# Patient Record
Sex: Female | Born: 1965 | Race: White | Hispanic: No | Marital: Married | Smoking: Former smoker
Health system: Southern US, Community
[De-identification: ages and names within clinical notes are randomized; demographics above are authoritative.]

## PROBLEM LIST (undated history)

## (undated) DIAGNOSIS — I10 Essential (primary) hypertension: Secondary | ICD-10-CM

## (undated) DIAGNOSIS — F329 Major depressive disorder, single episode, unspecified: Secondary | ICD-10-CM

## (undated) DIAGNOSIS — F32A Depression, unspecified: Secondary | ICD-10-CM

## (undated) DIAGNOSIS — F419 Anxiety disorder, unspecified: Secondary | ICD-10-CM

## (undated) HISTORY — PX: APPENDECTOMY: SHX54

## (undated) HISTORY — DX: Anxiety disorder, unspecified: F41.9

---

## 1990-12-13 HISTORY — PX: TUBAL LIGATION: SHX77

## 2005-05-07 ENCOUNTER — Ambulatory Visit: Payer: Self-pay | Admitting: Family Medicine

## 2005-06-18 ENCOUNTER — Emergency Department: Payer: Self-pay | Admitting: Emergency Medicine

## 2006-08-11 ENCOUNTER — Ambulatory Visit: Payer: Self-pay

## 2007-07-07 DIAGNOSIS — F419 Anxiety disorder, unspecified: Secondary | ICD-10-CM | POA: Insufficient documentation

## 2010-10-28 ENCOUNTER — Ambulatory Visit: Payer: Self-pay

## 2012-01-07 ENCOUNTER — Ambulatory Visit: Payer: Self-pay | Admitting: Family Medicine

## 2012-05-30 ENCOUNTER — Emergency Department: Payer: Self-pay | Admitting: Emergency Medicine

## 2014-06-10 ENCOUNTER — Emergency Department: Payer: Self-pay | Admitting: Emergency Medicine

## 2015-01-14 ENCOUNTER — Ambulatory Visit: Payer: Self-pay | Admitting: Family Medicine

## 2015-02-19 ENCOUNTER — Emergency Department: Payer: Self-pay | Admitting: Emergency Medicine

## 2015-02-28 ENCOUNTER — Ambulatory Visit: Payer: Self-pay | Admitting: Family Medicine

## 2015-03-31 ENCOUNTER — Ambulatory Visit
Admit: 2015-03-31 | Disposition: A | Discharge status: Home / Self Care | Payer: Self-pay | Attending: Orthopedic Surgery | Admitting: Orthopedic Surgery

## 2015-04-12 ENCOUNTER — Ambulatory Visit
Admit: 2015-04-12 | Disposition: A | Discharge status: Home / Self Care | Payer: Self-pay | Attending: Orthopedic Surgery | Admitting: Orthopedic Surgery

## 2015-06-14 ENCOUNTER — Emergency Department
Admission: EM | Admit: 2015-06-14 | Discharge: 2015-06-14 | Disposition: A | Discharge status: Home / Self Care | Payer: Self-pay | Attending: Emergency Medicine | Admitting: Emergency Medicine

## 2015-06-14 ENCOUNTER — Other Ambulatory Visit: Payer: Self-pay

## 2015-06-14 ENCOUNTER — Encounter: Payer: Self-pay | Admitting: Emergency Medicine

## 2015-06-14 DIAGNOSIS — H578 Other specified disorders of eye and adnexa: Secondary | ICD-10-CM | POA: Insufficient documentation

## 2015-06-14 DIAGNOSIS — F419 Anxiety disorder, unspecified: Secondary | ICD-10-CM | POA: Insufficient documentation

## 2015-06-14 DIAGNOSIS — I10 Essential (primary) hypertension: Secondary | ICD-10-CM | POA: Insufficient documentation

## 2015-06-14 DIAGNOSIS — Z87891 Personal history of nicotine dependence: Secondary | ICD-10-CM | POA: Insufficient documentation

## 2015-06-14 DIAGNOSIS — G8929 Other chronic pain: Secondary | ICD-10-CM | POA: Insufficient documentation

## 2015-06-14 DIAGNOSIS — M542 Cervicalgia: Secondary | ICD-10-CM | POA: Insufficient documentation

## 2015-06-14 DIAGNOSIS — R51 Headache: Secondary | ICD-10-CM | POA: Insufficient documentation

## 2015-06-14 DIAGNOSIS — R635 Abnormal weight gain: Secondary | ICD-10-CM | POA: Insufficient documentation

## 2015-06-14 DIAGNOSIS — E162 Hypoglycemia, unspecified: Secondary | ICD-10-CM | POA: Insufficient documentation

## 2015-06-14 DIAGNOSIS — F329 Major depressive disorder, single episode, unspecified: Secondary | ICD-10-CM | POA: Insufficient documentation

## 2015-06-14 HISTORY — DX: Essential (primary) hypertension: I10

## 2015-06-14 LAB — URINALYSIS COMPLETE WITH MICROSCOPIC (ARMC ONLY)
Bilirubin Urine: NEGATIVE
GLUCOSE, UA: NEGATIVE mg/dL
KETONES UR: NEGATIVE mg/dL
Leukocytes, UA: NEGATIVE
Nitrite: NEGATIVE
Protein, ur: NEGATIVE mg/dL
SPECIFIC GRAVITY, URINE: 1.01 (ref 1.005–1.030)
pH: 5 (ref 5.0–8.0)

## 2015-06-14 LAB — COMPREHENSIVE METABOLIC PANEL
ALT: 50 U/L (ref 14–54)
AST: 38 U/L (ref 15–41)
Albumin: 4.2 g/dL (ref 3.5–5.0)
Alkaline Phosphatase: 95 U/L (ref 38–126)
Anion gap: 10 (ref 5–15)
BILIRUBIN TOTAL: 0.6 mg/dL (ref 0.3–1.2)
BUN: 16 mg/dL (ref 6–20)
CO2: 26 mmol/L (ref 22–32)
Calcium: 9.4 mg/dL (ref 8.9–10.3)
Chloride: 103 mmol/L (ref 101–111)
Creatinine, Ser: 0.94 mg/dL (ref 0.44–1.00)
GFR calc Af Amer: >60 mL/min (ref >60)
GFR calc non Af Amer: >60 mL/min (ref >60)
Glucose, Bld: 134 mg/dL — ABNORMAL HIGH (ref 65–99)
POTASSIUM: 3.5 mmol/L (ref 3.5–5.1)
SODIUM: 139 mmol/L (ref 135–145)
Total Protein: 7.9 g/dL (ref 6.5–8.1)

## 2015-06-14 LAB — CBC
HCT: 43.5 % (ref 35.0–47.0)
Hemoglobin: 14.7 g/dL (ref 12.0–16.0)
MCH: 29.3 pg (ref 26.0–34.0)
MCHC: 33.8 g/dL (ref 32.0–36.0)
MCV: 86.8 fL (ref 80.0–100.0)
PLATELETS: 294 10*3/uL (ref 150–440)
RBC: 5.02 MIL/uL (ref 3.80–5.20)
RDW: 12.8 % (ref 11.5–14.5)
WBC: 8.7 10*3/uL (ref 3.6–11.0)

## 2015-06-14 LAB — TROPONIN I
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
## 2015-06-14 NOTE — ED Notes (Signed)
States felt dizzy for few seconds at 12 noon, continued to feel weak and nauseated after, able to eat after, states used childs glucometer, was 76 before eating, 85 after. Denies chest pain. States did feel winded.

## 2015-06-14 NOTE — Discharge Instructions (Signed)
Return to the emergency department for any worsening condition including chest pain, trouble breathing, weakness, numbness, confusion or altered mental status, depression or thoughts of wanting to harm yourself or others.  We discussed, try to avoid sugar as this may make you more likely to have a reactive low blood sugar. Read about incorporating high-quality fats in order to stabilize blood sugar including coconut oil, and avocados.  Follow up with a primary care doctor for further evaluation and management. I recommended following up with [facility] for intake assessment including possible counselor or psych history referral for depression and anxiety.   Hypoglycemia Hypoglycemia occurs when the glucose in your blood is too low. Glucose is a type of sugar that is your body's main energy source. Hormones, such as insulin and glucagon, control the level of glucose in the blood. Insulin lowers blood glucose and glucagon increases blood glucose. Having too much insulin in your blood stream, or not eating enough food containing sugar, can result in hypoglycemia. Hypoglycemia can happen to people with or without diabetes. It can develop quickly and can be a medical emergency.  CAUSES   Missing or delaying meals.  Not eating enough carbohydrates at meals.  Taking too much diabetes medicine.  Not timing your oral diabetes medicine or insulin doses with meals, snacks, and exercise.  Nausea and vomiting.  Certain medicines.  Severe illnesses, such as hepatitis, kidney disorders, and certain eating disorders.  Increased activity or exercise without eating something extra or adjusting medicines.  Drinking too much alcohol.  A nerve disorder that affects body functions like your heart rate, blood pressure, and digestion (autonomic neuropathy).  A condition where the stomach muscles do not function properly (gastroparesis). Therefore, medicines and food may not absorb properly.  Rarely, a tumor of the  pancreas can produce too much insulin. SYMPTOMS   Hunger.  Sweating (diaphoresis).  Change in body temperature.  Shakiness.  Headache.  Anxiety.  Lightheadedness.  Irritability.  Difficulty concentrating.  Dry mouth.  Tingling or numbness in the hands or feet.  Restless sleep or sleep disturbances.  Altered speech and coordination.  Change in mental status.  Seizures or prolonged convulsions.  Combativeness.  Drowsiness (lethargic).  Weakness.  Increased heart rate or palpitations.  Confusion.  Pale, gray skin color.  Blurred or double vision.  Fainting. DIAGNOSIS  A physical exam and medical history will be performed. Your caregiver may make a diagnosis based on your symptoms. Blood tests and other lab tests may be performed to confirm a diagnosis. Once the diagnosis is made, your caregiver will see if your signs and symptoms go away once your blood glucose is raised.  TREATMENT  Usually, you can easily treat your hypoglycemia when you notice symptoms.  Check your blood glucose. If it is less than 70 mg/dl, take one of the following:   3-4 glucose tablets.    cup juice.    cup regular soda.   1 cup skim milk.   -1 tube of glucose gel.   5-6 hard candies.   Avoid high-fat drinks or food that may delay a rise in blood glucose levels.  Do not take more than the recommended amount of sugary foods, drinks, gel, or tablets. Doing so will cause your blood glucose to go too high.   Wait 10-15 minutes and recheck your blood glucose. If it is still less than 70 mg/dl or below your target range, repeat treatment.   Eat a snack if it is more than 1 hour until your  next meal.  There may be a time when your blood glucose may go so low that you are unable to treat yourself at home when you start to notice symptoms. You may need someone to help you. You may even faint or be unable to swallow. If you cannot treat yourself, someone will need to  bring you to the hospital.  HOME CARE INSTRUCTIONS  If you have diabetes, follow your diabetes management plan by:  Taking your medicines as directed.  Following your exercise plan.  Following your meal plan. Do not skip meals. Eat on time.  Testing your blood glucose regularly. Check your blood glucose before and after exercise. If you exercise longer or different than usual, be sure to check blood glucose more frequently.  Wearing your medical alert jewelry that says you have diabetes.  Identify the cause of your hypoglycemia. Then, develop ways to prevent the recurrence of hypoglycemia.  Do not take a hot bath or shower right after an insulin shot.  Always carry treatment with you. Glucose tablets are the easiest to carry.  If you are going to drink alcohol, drink it only with meals.  Tell friends or family members ways to keep you safe during a seizure. This may include removing hard or sharp objects from the area or turning you on your side.  Maintain a healthy weight. SEEK MEDICAL CARE IF:   You are having problems keeping your blood glucose in your target range.  You are having frequent episodes of hypoglycemia.  You feel you might be having side effects from your medicines.  You are not sure why your blood glucose is dropping so low.  You notice a change in vision or a new problem with your vision. SEEK IMMEDIATE MEDICAL CARE IF:   Confusion develops.  A change in mental status occurs.  The inability to swallow develops.  Fainting occurs. Document Released: 11/29/2005 Document Revised: 12/04/2013 Document Reviewed: 03/27/2012 [facility] Patient Information 2015 [place], [place]. This information is not intended to replace advice given to you by your health care provider. Make sure you discuss any questions you have with your health care provider.

## 2015-06-17 ENCOUNTER — Telehealth: Payer: Self-pay | Admitting: Family Medicine

## 2015-06-17 NOTE — Telephone Encounter (Signed)
Pt was discharged from [facility] ER on 06/14/2015 for blood sugar dropped and anxiety/MJ

## 2015-06-18 DIAGNOSIS — M25519 Pain in unspecified shoulder: Secondary | ICD-10-CM | POA: Insufficient documentation

## 2015-06-18 DIAGNOSIS — E559 Vitamin D deficiency, unspecified: Secondary | ICD-10-CM | POA: Insufficient documentation

## 2015-06-18 DIAGNOSIS — L309 Dermatitis, unspecified: Secondary | ICD-10-CM | POA: Insufficient documentation

## 2015-06-18 DIAGNOSIS — IMO0002 Reserved for concepts with insufficient information to code with codable children: Secondary | ICD-10-CM | POA: Insufficient documentation

## 2015-06-18 DIAGNOSIS — M545 Low back pain, unspecified: Secondary | ICD-10-CM | POA: Insufficient documentation

## 2015-06-18 DIAGNOSIS — N912 Amenorrhea, unspecified: Secondary | ICD-10-CM | POA: Insufficient documentation

## 2015-06-18 DIAGNOSIS — M542 Cervicalgia: Secondary | ICD-10-CM | POA: Insufficient documentation

## 2015-06-18 DIAGNOSIS — I10 Essential (primary) hypertension: Secondary | ICD-10-CM | POA: Insufficient documentation

## 2015-06-19 ENCOUNTER — Ambulatory Visit (INDEPENDENT_AMBULATORY_CARE_PROVIDER_SITE_OTHER): Payer: Self-pay | Admitting: Family Medicine

## 2015-06-19 ENCOUNTER — Encounter: Payer: Self-pay | Admitting: Family Medicine

## 2015-06-19 ENCOUNTER — Other Ambulatory Visit: Payer: Self-pay | Admitting: Family Medicine

## 2015-06-19 VITALS — BP 140/80 | HR 78 | Temp 97.8°F | Resp 18 | Wt 258.0 lb

## 2015-06-19 DIAGNOSIS — F32A Depression, unspecified: Secondary | ICD-10-CM

## 2015-06-19 DIAGNOSIS — F329 Major depressive disorder, single episode, unspecified: Secondary | ICD-10-CM

## 2015-06-19 DIAGNOSIS — E162 Hypoglycemia, unspecified: Secondary | ICD-10-CM

## 2015-06-19 LAB — POCT GLYCOSYLATED HEMOGLOBIN (HGB A1C): Hemoglobin A1C: 5.4

## 2015-06-19 MED ORDER — ESCITALOPRAM OXALATE 10 MG PO TABS: 10.0000 mg | ORAL_TABLET | Freq: Every day | ORAL | Status: DC

## 2015-06-19 MED ORDER — CITALOPRAM HYDROBROMIDE 20 MG PO TABS: ORAL_TABLET | ORAL | Status: DC

## 2015-06-19 NOTE — Patient Instructions (Addendum)
Telephone f/u in 2 weeks. Try to avoid foods that may contribute to low blood sugar. Quit checking blood sugars except twice weekly.
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
## 2015-06-20 ENCOUNTER — Encounter: Payer: Self-pay | Admitting: Emergency Medicine

## 2015-06-20 ENCOUNTER — Emergency Department
Admission: EM | Admit: 2015-06-20 | Discharge: 2015-06-21 | Disposition: A | Discharge status: Home / Self Care | Payer: Self-pay | Attending: Emergency Medicine | Admitting: Emergency Medicine

## 2015-06-20 ENCOUNTER — Telehealth: Payer: Self-pay | Admitting: Family Medicine

## 2015-06-20 ENCOUNTER — Emergency Department
Admission: EM | Admit: 2015-06-20 | Discharge: 2015-06-20 | Disposition: A | Discharge status: Home / Self Care | Payer: Self-pay | Attending: Emergency Medicine | Admitting: Emergency Medicine

## 2015-06-20 DIAGNOSIS — I10 Essential (primary) hypertension: Secondary | ICD-10-CM | POA: Insufficient documentation

## 2015-06-20 DIAGNOSIS — Z79899 Other long term (current) drug therapy: Secondary | ICD-10-CM | POA: Insufficient documentation

## 2015-06-20 DIAGNOSIS — F419 Anxiety disorder, unspecified: Secondary | ICD-10-CM | POA: Insufficient documentation

## 2015-06-20 DIAGNOSIS — F329 Major depressive disorder, single episode, unspecified: Secondary | ICD-10-CM | POA: Insufficient documentation

## 2015-06-20 DIAGNOSIS — Z87891 Personal history of nicotine dependence: Secondary | ICD-10-CM | POA: Insufficient documentation

## 2015-06-20 DIAGNOSIS — F32A Depression, unspecified: Secondary | ICD-10-CM

## 2015-06-20 HISTORY — DX: Depression, unspecified: F32.A

## 2015-06-20 HISTORY — DX: Major depressive disorder, single episode, unspecified: F32.9

## 2015-06-20 LAB — URINALYSIS COMPLETE WITH MICROSCOPIC (ARMC ONLY)
BILIRUBIN URINE: NEGATIVE
Glucose, UA: NEGATIVE mg/dL
KETONES UR: NEGATIVE mg/dL
Nitrite: NEGATIVE
PROTEIN: 30 mg/dL — AB
Specific Gravity, Urine: 1.01 (ref 1.005–1.030)
pH: 7 (ref 5.0–8.0)

## 2015-06-20 LAB — URINE DRUG SCREEN, QUALITATIVE (ARMC ONLY)
AMPHETAMINES, UR SCREEN: NOT DETECTED
Barbiturates, Ur Screen: NOT DETECTED
Benzodiazepine, Ur Scrn: NOT DETECTED
COCAINE METABOLITE, UR ~~LOC~~: NOT DETECTED
Cannabinoid 50 Ng, Ur ~~LOC~~: NOT DETECTED
MDMA (ECSTASY) UR SCREEN: NOT DETECTED
Methadone Scn, Ur: NOT DETECTED
Opiate, Ur Screen: NOT DETECTED
Phencyclidine (PCP) Ur S: NOT DETECTED
TRICYCLIC, UR SCREEN: NOT DETECTED

## 2015-06-20 LAB — COMPREHENSIVE METABOLIC PANEL
ALT: 59 U/L — ABNORMAL HIGH (ref 14–54)
AST: 42 U/L — ABNORMAL HIGH (ref 15–41)
Albumin: 4.7 g/dL (ref 3.5–5.0)
Alkaline Phosphatase: 99 U/L (ref 38–126)
Anion gap: 13 (ref 5–15)
BUN: 14 mg/dL (ref 6–20)
CO2: 23 mmol/L (ref 22–32)
Calcium: 9.8 mg/dL (ref 8.9–10.3)
Chloride: 103 mmol/L (ref 101–111)
Creatinine, Ser: 0.95 mg/dL (ref 0.44–1.00)
GFR calc Af Amer: >60 mL/min (ref >60)
GFR calc non Af Amer: >60 mL/min (ref >60)
Glucose, Bld: 119 mg/dL — ABNORMAL HIGH (ref 65–99)
Potassium: 3.6 mmol/L (ref 3.5–5.1)
Sodium: 139 mmol/L (ref 135–145)
Total Bilirubin: 0.9 mg/dL (ref 0.3–1.2)
Total Protein: 8.6 g/dL — ABNORMAL HIGH (ref 6.5–8.1)

## 2015-06-20 LAB — CBC
HCT: 45.5 % (ref 35.0–47.0)
Hemoglobin: 15.3 g/dL (ref 12.0–16.0)
MCH: 28.9 pg (ref 26.0–34.0)
MCHC: 33.6 g/dL (ref 32.0–36.0)
MCV: 85.9 fL (ref 80.0–100.0)
PLATELETS: 343 10*3/uL (ref 150–440)
RBC: 5.3 MIL/uL — AB (ref 3.80–5.20)
RDW: 13 % (ref 11.5–14.5)
WBC: 11.1 10*3/uL — ABNORMAL HIGH (ref 3.6–11.0)

## 2015-06-20 LAB — SALICYLATE LEVEL: Salicylate Lvl: <4 mg/dL (ref 2.8–30.0)

## 2015-06-20 LAB — ETHANOL: Alcohol, Ethyl (B): <5 mg/dL (ref <5)

## 2015-06-20 LAB — ACETAMINOPHEN LEVEL: Acetaminophen (Tylenol), Serum: <10 ug/mL — ABNORMAL LOW (ref 10–30)

## 2015-06-20 NOTE — ED Notes (Addendum)
Pt laying in the bed

## 2015-06-20 NOTE — ED Notes (Signed)

## 2015-06-20 NOTE — ED Notes (Signed)
Pt with agitation  "I just want a pill to make my nerves calm down - i don't want to talk -I just want a pill."   Labs reviewed with her and when I told her about her glucose being 119 she stated  "My sugar stays there - and what is being done about it  - I know that my sugar drops low."  Pt informed that her glucose was actually a little high and she turned and groaned as if I do not know what is high or low   Drink and crackers provided as a snack and to ease her mind that her sugar will not drop too low

## 2015-06-20 NOTE — Telephone Encounter (Signed)
Pt went to [facility] today and the ER.  She wants you to call her back.  Still having extreem anxiety/ [phone].

## 2015-06-20 NOTE — ED Notes (Signed)
Pt continues to cry and say "yall please help me, i need help."

## 2015-06-20 NOTE — ED Notes (Signed)
BEHAVIORAL HEALTH ROUNDING Patient sleeping: No. Patient alert and oriented: yes Behavior appropriate: Yes.  ; If no, describe:  Nutrition and fluids offered: yes Toileting and hygiene offered: Yes  Sitter present: q15 minute observations and security camera monitoring Law enforcement present: Yes  ODS  
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
## 2015-06-20 NOTE — ED Notes (Signed)
Pt given meal tray.

## 2015-06-20 NOTE — Telephone Encounter (Signed)
Left message. No answer x 2
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
## 2015-06-20 NOTE — ED Notes (Addendum)
BEHAVIORAL HEALTH ROUNDING  Patient sleeping: YES Patient alert and oriented: YES Behavior appropriate: YES. ; If no, describe:  Nutrition and fluids offered: YES Toileting and hygiene offered: Programmer, multimediaYES Sitter present: YES  Patent examinerLaw enforcement present: YES   ENVIRONMENTAL ASSESSMENT  Potentially harmful objects out of patient reach: YES  Personal belongings secured: YES  Patient dressed in hospital provided attire only: YES Plastic bags out of patient reach: YES Patient care equipment (cords, cables, call bells, lines, and drains) shortened, removed, or accounted for: YES Equipment and supplies removed from bottom of stretcher: YES Potentially toxic materials out of patient reach: [place] Sharps container removed or out of patient reach: PT IN [employer]

## 2015-06-20 NOTE — ED Notes (Signed)
Pt transferred into ED BHU room 5    Patient assigned to appropriate care area. Patient oriented to unit/care area: Informed that, for their safety, care areas are designed for safety and monitored by security cameras at all times; Visiting hours and phone times explained to patient. Patient verbalizes understanding, and verbal contract for safety obtained.

## 2015-06-20 NOTE — ED Notes (Signed)
ED BHU PLACEMENT JUSTIFICATION Is the patient under IVC or is there intent for IVC: Yes.   Is the patient medically cleared: Yes.   Is there vacancy in the ED BHU: Yes.   Is the population mix appropriate for patient: Yes.   Is the patient awaiting placement in inpatient or outpatient setting: no   Has the patient had a psychiatric consult:  pending  Survey of unit performed for contraband, proper placement and condition of furniture, tampering with fixtures in bathroom, shower, and each patient room: Yes.  ; Findings:  APPEARANCE/BEHAVIOR Calm and cooperative NEURO ASSESSMENT Orientation: oriented x3  Denies pain Hallucinations: No.None noted (Hallucinations) Speech: Normal Gait: normal RESPIRATORY ASSESSMENT Even  Unlabored respirations  CARDIOVASCULAR ASSESSMENT Pulses equal   regular rate  Skin warm and dry   GASTROINTESTINAL ASSESSMENT no GI complaint EXTREMITIES Full ROM  PLAN OF CARE Provide calm/safe environment. Vital signs assessed twice daily. ED BHU Assessment once each 12-hour shift. Collaborate with intake RN daily or as condition indicates. Assure the ED provider has rounded once each shift. Provide and encourage hygiene. Provide redirection as needed. Assess for escalating behavior; address immediately and inform ED provider.  Assess family dynamic and appropriateness for visitation as needed: Yes.  ; If necessary, describe findings:  Educate the patient/family about BHU procedures/visitation: Yes.  ; If necessary, describe findings:
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
## 2015-06-20 NOTE — ED Notes (Signed)
Patient to ED for high anxiety. She was given a prescription for Citalopram and when she tried to take it she "freaked out." States she can't bring herself to take it because she is afraid something will happen. States she knows she has anxiety because of her living situation but can't make her "mind stop." Denies SI or HI just needs to "get it fixed." Says she can't take her yeast infection medication either because she is afraid something will happen.

## 2015-06-20 NOTE — Telephone Encounter (Signed)
Advise

## 2015-06-20 NOTE — ED Notes (Addendum)
Dinner tray delivered to patient.  No other concerns expressed at this time by the patient.

## 2015-06-20 NOTE — ED Notes (Signed)
[facility]. Patient care assumed. Patient/RN introduction complete.  Margaret Rn in to eval, pt calm and cooperative at this time will continue to monitor.

## 2015-06-20 NOTE — ED Notes (Signed)

## 2015-06-20 NOTE — ED Notes (Signed)
Pt to ED c/o anxiety and unable to sleep. Pt denies SI or HI, states that her stressors are work and financially related.

## 2015-06-20 NOTE — ED Notes (Signed)

## 2015-06-20 NOTE — ED Notes (Signed)

## 2015-06-20 NOTE — ED Notes (Signed)
Pt appears very anxious, states that she cannot get her mind to stop racing, unable to sleep. Pt informed that it is very important that she follows up with [facility] upon discharge from ER. Pt states that she does not understand why she is feeling like this, no previous hx of such. Pt in room at this time with lights dimmed to rest and calm down. Denies SI or HI

## 2015-06-20 NOTE — Discharge Instructions (Signed)
Please seek medical attention and help for any thoughts about wanting to harm herself, harm others, any concerning change in behavior, severe depression, inappropriate drug use or any other new or concerning symptoms. Generalized Anxiety Disorder Generalized anxiety disorder (GAD) is a mental disorder. It interferes with life functions, including relationships, work, and school. GAD is different from normal anxiety, which everyone experiences at some point in their lives in response to specific life events and activities. Normal anxiety actually helps us prepare for and get through these life events and activities. Normal anxiety goes away after the event or activity is over.  GAD causes anxiety that is not necessarily related to specific events or activities. It also causes excess anxiety in proportion to specific events or activities. The anxiety associated with GAD is also difficult to control. GAD can vary from mild to severe. People with severe GAD can have intense waves of anxiety with physical symptoms (panic attacks).  SYMPTOMS The anxiety and worry associated with GAD are difficult to control. This anxiety and worry are related to many life events and activities and also occur more days than not for 6 months or longer. People with GAD also have three or more of the following symptoms (one or more in children):  Restlessness.   Fatigue.  Difficulty concentrating.   Irritability.  Muscle tension.  Difficulty sleeping or unsatisfying sleep. DIAGNOSIS GAD is diagnosed through an assessment by your health care provider. Your health care provider will ask you questions aboutyour mood,physical symptoms, and events in your life. Your health care provider may ask you about your medical history and use of alcohol or drugs, including prescription medicines. Your health care provider may also do a physical exam and blood tests. Certain medical conditions and the use of certain substances can  cause symptoms similar to those associated with GAD. Your health care provider may refer you to a mental health specialist for further evaluation. TREATMENT The following therapies are usually used to treat GAD:   Medication. Antidepressant medication usually is prescribed for long-term daily control. Antianxiety medicines may be added in severe cases, especially when panic attacks occur.   Talk therapy (psychotherapy). Certain types of talk therapy can be helpful in treating GAD by providing support, education, and guidance. A form of talk therapy called cognitive behavioral therapy can teach you healthy ways to think about and react to daily life events and activities.  Stress managementtechniques. These include yoga, meditation, and exercise and can be very helpful when they are practiced regularly. A mental health specialist can help determine which treatment is best for you. Some people see improvement with one therapy. However, other people require a combination of therapies. Document Released: 03/26/2013 Document Revised: 04/15/2014 Document Reviewed: 03/26/2013 [facility] Patient Information 2015 [place], [place]. This information is not intended to replace advice given to you by your health care provider. Make sure you discuss any questions you have with your health care provider.
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
## 2015-06-20 NOTE — ED Notes (Signed)
Pt alert and oriented X4, active, cooperative, pt in NAD. RR even and unlabored, color WNL.  Pt informed to return if any life threatening symptoms occur.   

## 2015-06-20 NOTE — ED Notes (Signed)

## 2015-06-21 MED ORDER — ACETAMINOPHEN 325 MG PO TABS
650.0000 mg | ORAL_TABLET | Freq: Four times a day (QID) | ORAL | Status: DC | PRN
  Administered 2015-06-21: 650 mg via ORAL
  Filled 2015-06-21: qty 2

## 2015-06-21 MED ORDER — HYDROCHLOROTHIAZIDE 12.5 MG PO CAPS
12.5000 mg | ORAL_CAPSULE | Freq: Every day | ORAL | Status: DC
  Administered 2015-06-21: 12.5 mg via ORAL

## 2015-06-21 MED ORDER — HYDROCHLOROTHIAZIDE 12.5 MG PO CAPS
ORAL_CAPSULE | ORAL | Status: AC
Start: 2015-06-21 — End: 2015-06-21
  Administered 2015-06-21: 12.5 mg via ORAL
  Filled 2015-06-21: qty 1

## 2015-06-21 MED ORDER — METOPROLOL SUCCINATE ER 50 MG PO TB24
50.0000 mg | ORAL_TABLET | Freq: Every day | ORAL | Status: DC
  Administered 2015-06-21: 50 mg via ORAL

## 2015-06-21 MED ORDER — VITAMIN E 180 MG (400 UNIT) PO CAPS
400.0000 [IU] | ORAL_CAPSULE | Freq: Every day | ORAL | Status: DC
  Administered 2015-06-21: 400 [IU] via ORAL
  Filled 2015-06-21: qty 1

## 2015-06-21 MED ORDER — HYDROXYZINE HCL 25 MG PO TABS
25.0000 mg | ORAL_TABLET | Freq: Three times a day (TID) | ORAL | Status: DC
  Administered 2015-06-21: 25 mg via ORAL
  Filled 2015-06-21: qty 1

## 2015-06-21 MED ORDER — VITAMIN D 1000 UNITS PO TABS
1000.0000 [IU] | ORAL_TABLET | Freq: Every day | ORAL | Status: DC
  Administered 2015-06-21: 1000 [IU] via ORAL

## 2015-06-21 MED ORDER — VITAMIN D 1000 UNITS PO TABS
ORAL_TABLET | ORAL | Status: AC
  Administered 2015-06-21: 1000 [IU] via ORAL
  Filled 2015-06-21: qty 1

## 2015-06-21 MED ORDER — METOPROLOL SUCCINATE ER 50 MG PO TB24
ORAL_TABLET | ORAL | Status: AC
  Administered 2015-06-21: 50 mg via ORAL
  Filled 2015-06-21: qty 1

## 2015-06-21 MED ORDER — METOPROLOL TARTRATE 25 MG PO TABS
ORAL_TABLET | ORAL | Status: AC
  Filled 2015-06-21: qty 2

## 2015-06-21 MED ORDER — HYDROXYZINE PAMOATE 50 MG PO CAPS: 50.0000 mg | ORAL_CAPSULE | Freq: Four times a day (QID) | ORAL | Status: DC | PRN

## 2015-06-21 NOTE — ED Notes (Signed)
No change in condition, will continue to monitor.  

## 2015-06-21 NOTE — Discharge Instructions (Signed)
You have been seen in the Emergency Department (ED)  today for psychiatric issues.  You have been evaluated by the behavioral medicine specialists and are being referred to:  [facility] [address] [place], [place] [address] Phone:  [phone] or [phone]  Open Access:   Walk-in ASSESSMENT hours, M-W-F, 8:00am - 3:00pm Advanced Acess CRISIS:  M-F, 8:00am - 8:00pm Outpatient Services Office Hours:  M-F, 8:00am - 5:00pm  Please return to the Emergency Department (ED)  immediately if you have ANY thoughts of hurting yourself or anyone else, so that we may help you.  Please avoid alcohol and drug use.  Follow up with your doctor and/or therapist as soon as possible regarding today's ED  visit.   Please follow up any other recommendations and clinic appointments provided by the psychiatry team that saw you in the Emergency Department.   Panic Attacks Panic attacks are sudden, short-livedsurges of severe anxiety, fear, or discomfort. They may occur for no reason when you are relaxed, when you are anxious, or when you are sleeping. Panic attacks may occur for a number of reasons:   Healthy people occasionally have panic attacks in extreme, life-threatening situations, such as war or natural disasters. Normal anxiety is a protective mechanism of the body that helps [place] react to danger (fight or flight response).  Panic attacks are often seen with anxiety disorders, such as panic disorder, social anxiety disorder, generalized anxiety disorder, and phobias. Anxiety disorders cause excessive or uncontrollable anxiety. They may interfere with your relationships or other life activities.  Panic attacks are sometimes seen with other mental illnesses, such as depression and posttraumatic stress disorder.  Certain medical conditions, prescription medicines, and drugs of abuse can cause panic attacks. SYMPTOMS  Panic attacks start suddenly, peak within  20 minutes, and are accompanied by four or more of the following symptoms:  Pounding heart or fast heart rate (palpitations).  Sweating.  Trembling or shaking.  Shortness of breath or feeling smothered.  Feeling choked.  Chest pain or discomfort.  Nausea or strange feeling in your stomach.  Dizziness, light-headedness, or feeling like you will faint.  Chills or hot flushes.  Numbness or tingling in your lips or hands and feet.  Feeling that things are not real or feeling that you are not yourself.  Fear of losing control or going crazy.  Fear of dying. Some of these symptoms can mimic serious medical conditions. For example, you may think you are having a heart attack. Although panic attacks can be very scary, they are not life threatening. DIAGNOSIS  Panic attacks are diagnosed through an assessment by your health care provider. Your health care provider will ask questions about your symptoms, such as where and when they occurred. Your health care provider will also ask about your medical history and use of alcohol and drugs, including prescription medicines. Your health care provider may order blood tests or other studies to rule out a serious medical condition. Your health care provider may refer you to a mental health professional for further evaluation. TREATMENT   Most healthy people who have one or two panic attacks in an extreme, life-threatening situation will not require treatment.  The treatment for panic attacks associated with anxiety disorders or other mental illness typically involves counseling with a mental health professional, medicine, or a combination of both. Your health care provider will help determine what treatment is best for you.  Panic attacks due to physical illness usually go away  with treatment of the illness. If prescription medicine is causing panic attacks, talk with your health care provider about stopping the medicine, decreasing the dose, or  substituting another medicine.  Panic attacks due to alcohol or drug abuse go away with abstinence. Some adults need professional help in order to stop drinking or using drugs. HOME CARE INSTRUCTIONS   Take all medicines as directed by your health care provider.   Schedule and attend follow-up visits as directed by your health care provider. It is important to keep all your appointments. SEEK MEDICAL CARE IF:  You are not able to take your medicines as prescribed.  Your symptoms do not improve or get worse. SEEK IMMEDIATE MEDICAL CARE IF:   You experience panic attack symptoms that are different than your usual symptoms.  You have serious thoughts about hurting yourself or others.  You are taking medicine for panic attacks and have a serious side effect. MAKE SURE YOU:  Understand these instructions.  Will watch your condition.  Will get help right away if you are not doing well or get worse. Document Released: 11/29/2005 Document Revised: 12/04/2013 Document Reviewed: 07/13/2013 [facility]Exit[facility] Patient Information 2015 [place], [place]. This information is not intended to replace advice given to you by your health care provider. Make sure you discuss any questions you have with your health care provider.  Social Anxiety Disorder Social anxiety disorder, previously called social phobia, is a mental disorder. People with social anxiety disorder frequently feel nervous, afraid, or embarrassed when around other people in social situations. They constantly worry that other people are judging or criticizing them for how they look, what they say, or how they act. They may worry that other people might reject them because of their appearance or behavior. Social anxiety disorder is more than just occasional shyness or self-consciousness. It can cause severe emotional distress. It can interfere with daily life activities. Social anxiety disorder also may lead to excessive alcohol or drug use and even  suicide.  Social anxiety disorder is actually one of the most common mental disorders. It can develop at any time but usually starts in the teenage years. Women are more commonly affected than men. Social anxiety disorder is also more common in people who have family members with anxiety disorders. It also is more common in people who have physical deformities or conditions with characteristics that are obvious to others, such as stuttered speech or movement abnormalities (Parkinson disease).  SYMPTOMS  In addition to feeling anxious or fearful in social situations, people with social anxiety disorder frequently have physical symptoms. Examples include:  Red face (blushing).  Racing heart.  Sweating.  Shaky hands or voice.  Confusion.  Light-headedness.  Upset stomach and diarrhea. DIAGNOSIS  Social anxiety disorder is diagnosed through an assessment by your health care provider. Your health care provider will ask you questions about your mood, thoughts, and reactions in social situations. Your health care provider may ask you about your medical history and use of alcohol or drugs, including prescription medicines. Certain medical conditions and the use of certain substances, including caffeine, can cause symptoms similar to social anxiety disorder. Your health care provider may refer you to a mental health specialist for further evaluation or treatment. The criteria for diagnosis of social anxiety disorder are:  Marked fear or anxiety in one or more social situations in which you may be closely watched or studied by others. Examples of such situations include:  Interacting socially (having a conversation with others, going to  a party, or meeting strangers).  Being observed (eating or drinking in public or being called on in class).  Performing in front of others (giving a speech).  The social situations of concern almost always cause fear or anxiety, not just occasionally.  People  with social anxiety disorder fear that they will be viewed negatively in a way that will be embarrassing, will lead to rejection, or will offend others. This fear is out of proportion to the actual threat posed by the social situation.  Often the triggering social situations are avoided, or they are endured with intense fear or anxiety. The fear, anxiety, or avoidance is persistent and lasts for 6 months or longer.  The anxiety causes difficulty functioning in at least some parts of your daily life. TREATMENT  Several types of treatment are available for social anxiety disorder. These treatments are often used in combination and include:   Talk therapy. Group talk therapy allows you to see that you are not alone with these problems. Individual talk therapy helps you address your specific anxiety issues with a caring professional. The most effective forms of talk therapy for social anxiety disorder are cognitive-behavioral therapy and exposure therapy. Cognitive-behavioral therapy helps you to identify and change negative thoughts and beliefs that are at the root of the disorder. Exposure therapy allows you to gradually face the situations that you fear most.  Relaxation and coping techniques. These include deep breathing, self-talk, meditation, visual imagery, and yoga. Relaxation techniques help to keep you calm in social situations.  Social Optician, dispensing.Social skills can be learned on your own or with the help of a talk therapist. They can help you feel more confident and comfortable in social situations.  Medicine. For anxiety limited to performance situations (performance anxiety), medicine called beta blockers can help by reducing or preventing the physical symptoms of social anxiety disorder. For more persistent and generalized social anxiety, antidepressant medicine may be prescribed to help control symptoms. In severe cases of social anxiety disorder, strong antianxiety medicine, called  benzodiazepines, may be prescribed on a limited basis and for a short time. Document Released: 10/28/2005 Document Revised: 04/15/2014 Document Reviewed: 02/27/2013 [facility] Patient Information 2015 [place], [place]. This information is not intended to replace advice given to you by your health care provider. Make sure you discuss any questions you have with your health care provider.

## 2015-06-21 NOTE — ED Notes (Signed)

## 2015-06-21 NOTE — ED Notes (Signed)
Pt very concerned about the appearance of the meds presented to her by RN.  She examined each tablet carefully; wanted to know what each pill was; shook the meds in the container and kept looking at them.  RN inquired about pt's concerns.  Pt said that she was "afraid" of medication and that what RN was giving her did not look like the meds she took at home.  RN explained that the hospital meds often did not look like [facility] meds, so RN explained what each tablet was.  Pt accepted explanation and took the meds.

## 2015-06-21 NOTE — ED Notes (Signed)
MD, SW, RN rounding on this pt.
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
## 2015-06-21 NOTE — ED Notes (Signed)
BEHAVIORAL HEALTH ROUNDING Patient sleeping: Yes.   Patient alert and oriented: not applicable Behavior appropriate: Yes.  ; If no, describe:  Nutrition and fluids offered: No Toileting and hygiene offered: No Sitter present: not applicable Law enforcement present: Yes  

## 2015-06-21 NOTE — ED Notes (Signed)
BEHAVIORAL HEALTH ROUNDING Patient sleeping: No. Patient alert and oriented: yes Behavior appropriate: Yes.  ; If no, describe:  Nutrition and fluids offered: Yes  Toileting and hygiene offered: Yes  Sitter present: not applicable Law enforcement present: Yes  

## 2015-06-21 NOTE — ED Notes (Addendum)
Pt accepts call from fiancee.

## 2015-06-21 NOTE — ED Provider Notes (Signed)
-----------------------------------------   6:26 AM on 06/21/2015 -----------------------------------------   BP 140/66 mmHg  Pulse 98  Temp(Src) 97.8 F (36.6 C) (Oral)  Resp 18  Ht 5\' 5"  (1.651 m)  Wt 256 lb (116.121 kg)  BMI 42.60 kg/m2  SpO2 97%  LMP  (LMP Unknown)  The patient had no acute events since last update.  Calm and cooperative at this time.  Disposition is pending per Psychiatry/Behavioral Medicine team recommendations.     Irean HongJade J Sung, MD 06/21/15 [phone]

## 2015-06-21 NOTE — ED Notes (Signed)
Discharge instructions discussed with pt.  Pt verbalized understanding and willingness to comply.

## 2015-06-21 NOTE — ED Provider Notes (Signed)
-----------------------------------------   4:32 PM on 06/21/2015 -----------------------------------------   BP 147/75 mmHg  Pulse 55  Temp(Src) 98 F (36.7 C) (Oral)  Resp 18  Ht 5\' 5"  (1.651 m)  Wt 256 lb (116.121 kg)  BMI 42.60 kg/m2  SpO2 99%  LMP  (LMP Unknown)  Patient has been evaluated in person by Dr. Guss Bundehalla who believes that the patient is stable for discharge.  Dr. Ranelle Oysterhalla's note recommends starting the patient on Vistaril.  I will advise close outpatient follow-up with [facility].  Loleta Roseory Aava Deland, MD 06/21/15 [phone]

## 2015-06-21 NOTE — ED Notes (Signed)
ED BHU PLACEMENT JUSTIFICATION Is the patient under IVC or is there intent for IVC: Yes.   Is the patient medically cleared: Yes.   Is there vacancy in the ED BHU: Yes.   Is the population mix appropriate for patient: Yes.   Is the patient awaiting placement in inpatient or outpatient setting: No. Has the patient had a psychiatric consult: No. Survey of unit performed for contraband, proper placement and condition of furniture, tampering with fixtures in bathroom, shower, and each patient room: Yes.  ; Findings:  APPEARANCE/BEHAVIOR calm, cooperative and adequate rapport can be established NEURO ASSESSMENT Orientation: time, place and person Hallucinations: No.None noted (Hallucinations) Speech: Normal Gait: normal RESPIRATORY ASSESSMENT Normal expansion.  Clear to auscultation.  No rales, rhonchi, or wheezing. CARDIOVASCULAR ASSESSMENT regular rate and rhythm, S1, S2 normal, no murmur, click, rub or gallop GASTROINTESTINAL ASSESSMENT soft, nontender, BS WNL, no r/g EXTREMITIES normal strength, tone, and muscle mass PLAN OF CARE Provide calm/safe environment. Vital signs assessed twice daily. ED BHU Assessment once each 12-hour shift. Collaborate with intake RN daily or as condition indicates. Assure the ED provider has rounded once each shift. Provide and encourage hygiene. Provide redirection as needed. Assess for escalating behavior; address immediately and inform ED provider.  Assess family dynamic and appropriateness for visitation as needed: Yes.  ; If necessary, describe findings:  Educate the patient/family about BHU procedures/visitation: Yes.  ; If necessary, describe findings:  

## 2015-06-21 NOTE — ED Notes (Addendum)
Pt laying in bed.  

## 2015-06-21 NOTE — ED Notes (Signed)
Pt laying in the bed

## 2015-06-21 NOTE — ED Notes (Signed)
Pt laying in bed.  

## 2015-06-21 NOTE — Progress Notes (Signed)
Patient evaluated by psych MD with recommendation to discontinue IVC and discharge pt home with follow up.  Patient has an appointment with [facility] on Thursday July 14th.  CSW will provide patient with 24 Hour crisis hotline to call in the event she needs to talk with someone due to her anxiety.  CSW also provide patient with community resources for psychosocial concerns.    Patient is in agreement with discharge plan and was appreciative of information provided, states she will follow up with her appointment.  ER-MD and RN informed of discharge plan.  Sammuel Hineseborah Moore. Theresia MajorsLCSWA, MSW Clinical Social Work Department Emergency Room [phone] 4:07 PM

## 2015-06-21 NOTE — ED Notes (Signed)
Pt in the shower 

## 2015-06-21 NOTE — ED Notes (Signed)
Pt describes panic attacks and anxiety to mental health team.

## 2015-06-21 NOTE — ED Notes (Signed)

## 2015-07-02 ENCOUNTER — Other Ambulatory Visit: Payer: Self-pay | Admitting: Family Medicine

## 2015-07-02 ENCOUNTER — Telehealth: Payer: Self-pay | Admitting: Family Medicine

## 2015-07-02 DIAGNOSIS — F419 Anxiety disorder, unspecified: Secondary | ICD-10-CM

## 2015-07-02 MED ORDER — HYDROXYZINE PAMOATE 50 MG PO CAPS: 50.0000 mg | ORAL_CAPSULE | Freq: Four times a day (QID) | ORAL | Status: DC | PRN

## 2015-07-02 NOTE — Telephone Encounter (Signed)
States she is tolerating citalopram and has hydroxyzine for acute anxiety. She has used up all of the hydroxyzine from the ER and wishes a refill. States she does have f/u with [facility]. We will do another phone f/u in two weeks before she runs out of citalopram.

## 2015-07-02 NOTE — Telephone Encounter (Signed)
Pt states she is suppose to have a follow up call with Nadine CountsBob this week.  Pt is requesting a call back.  CB#[phone]/MJ

## 2015-07-02 NOTE — Telephone Encounter (Signed)
Patient states that she was gonna have telephone follow up with you, she would like call back at number listed below. KW

## 2015-07-07 ENCOUNTER — Other Ambulatory Visit: Payer: Self-pay | Admitting: Family Medicine

## 2015-07-07 DIAGNOSIS — F419 Anxiety disorder, unspecified: Secondary | ICD-10-CM

## 2015-07-07 MED ORDER — HYDROXYZINE PAMOATE 50 MG PO CAPS: 50.0000 mg | ORAL_CAPSULE | Freq: Four times a day (QID) | ORAL | Status: DC | PRN

## 2015-07-07 NOTE — Telephone Encounter (Signed)
I have sent it again.

## 2015-07-07 NOTE — Telephone Encounter (Signed)
Verfied with pharmacy if Rx was received and pharmacist states prescription was never received gave verbal order on phone. KW

## 2015-07-07 NOTE — Telephone Encounter (Signed)
Please Review

## 2015-07-07 NOTE — Telephone Encounter (Signed)
Pt states [facility] [address] has not rec'd  the Rx for hydrOXYzine (VISTARIL) 50 MG capsule.  CB#[phone]/MJ

## 2015-08-20 ENCOUNTER — Telehealth: Payer: Self-pay | Admitting: Family Medicine

## 2015-08-20 NOTE — Telephone Encounter (Signed)
Refill request

## 2015-08-20 NOTE — Telephone Encounter (Signed)
Pt contacted office for refill request on the following medications:  citalopram (CELEXA) 20 MG and metoprolol succinate (TOPROL XL) 50 MG 24 hr tablet.  [facility] [address]  CB#[phone]/MW

## 2015-08-21 ENCOUNTER — Other Ambulatory Visit: Payer: Self-pay | Admitting: Family Medicine

## 2015-08-21 DIAGNOSIS — I10 Essential (primary) hypertension: Secondary | ICD-10-CM

## 2015-08-21 DIAGNOSIS — F329 Major depressive disorder, single episode, unspecified: Secondary | ICD-10-CM

## 2015-08-21 DIAGNOSIS — F32A Depression, unspecified: Secondary | ICD-10-CM

## 2015-08-21 MED ORDER — CITALOPRAM HYDROBROMIDE 20 MG PO TABS: ORAL_TABLET | ORAL | Status: DC

## 2015-08-21 MED ORDER — METOPROLOL SUCCINATE ER 50 MG PO TB24: 50.0000 mg | ORAL_TABLET | Freq: Every day | ORAL | Status: DC

## 2015-09-22 ENCOUNTER — Other Ambulatory Visit: Payer: Self-pay | Admitting: Family Medicine

## 2015-09-22 DIAGNOSIS — F419 Anxiety disorder, unspecified: Secondary | ICD-10-CM

## 2015-09-22 MED ORDER — HYDROXYZINE PAMOATE 50 MG PO CAPS: 50.0000 mg | ORAL_CAPSULE | Freq: Four times a day (QID) | ORAL | Status: DC | PRN

## 2015-10-27 ENCOUNTER — Other Ambulatory Visit: Payer: Self-pay | Admitting: Family Medicine

## 2015-10-27 ENCOUNTER — Telehealth: Payer: Self-pay | Admitting: Family Medicine

## 2015-10-27 DIAGNOSIS — I10 Essential (primary) hypertension: Secondary | ICD-10-CM

## 2015-10-27 MED ORDER — HYDROCHLOROTHIAZIDE 12.5 MG PO CAPS: 12.5000 mg | ORAL_CAPSULE | Freq: Every day | ORAL | Status: DC

## 2015-10-27 NOTE — Telephone Encounter (Signed)
HCTZ sent in.

## 2015-10-27 NOTE — Telephone Encounter (Signed)
Pt contacted office for refill request on the following medications: hydrochlorothiazide (MICROZIDE) 12.5 MG capsule. Pt stated that she has been trying to get this from [facility] for a week and is now out of the medication. Pt would like this sent in today if possible to [facility] [address] because she said she really needs this today. Please advise. Thanks TNP

## 2015-10-27 NOTE — Telephone Encounter (Signed)
Refill request

## 2015-10-27 NOTE — Telephone Encounter (Signed)
Patient has been advised Rx sent in. KW

## 2016-02-24 ENCOUNTER — Other Ambulatory Visit: Payer: Self-pay | Admitting: Family Medicine

## 2016-04-24 ENCOUNTER — Other Ambulatory Visit: Payer: Self-pay | Admitting: Family Medicine

## 2016-04-26 ENCOUNTER — Other Ambulatory Visit: Payer: Self-pay | Admitting: Family Medicine

## 2016-05-24 ENCOUNTER — Other Ambulatory Visit: Payer: Self-pay | Admitting: Family Medicine

## 2016-06-16 ENCOUNTER — Other Ambulatory Visit: Payer: Self-pay | Admitting: Family Medicine

## 2016-07-30 ENCOUNTER — Encounter: Payer: Self-pay | Admitting: Family Medicine

## 2016-07-30 ENCOUNTER — Ambulatory Visit (INDEPENDENT_AMBULATORY_CARE_PROVIDER_SITE_OTHER): Payer: BC Managed Care – PPO | Admitting: Family Medicine

## 2016-07-30 VITALS — BP 120/78 | HR 70 | Temp 97.9°F | Wt 249.4 lb

## 2016-07-30 DIAGNOSIS — F419 Anxiety disorder, unspecified: Secondary | ICD-10-CM

## 2016-07-30 DIAGNOSIS — E7800 Pure hypercholesterolemia, unspecified: Secondary | ICD-10-CM

## 2016-07-30 DIAGNOSIS — I10 Essential (primary) hypertension: Secondary | ICD-10-CM | POA: Diagnosis not present

## 2016-07-30 DIAGNOSIS — E559 Vitamin D deficiency, unspecified: Secondary | ICD-10-CM | POA: Diagnosis not present

## 2016-07-30 MED ORDER — CLONAZEPAM 0.5 MG PO TABS: 0.5000 mg | ORAL_TABLET | Freq: Two times a day (BID) | ORAL | 1 refills | Status: DC | PRN

## 2016-07-30 MED ORDER — HYDROCHLOROTHIAZIDE 12.5 MG PO CAPS: ORAL_CAPSULE | ORAL | 1 refills | Status: DC

## 2016-07-30 MED ORDER — CITALOPRAM HYDROBROMIDE 20 MG PO TABS: ORAL_TABLET | ORAL | 1 refills | Status: DC

## 2016-07-30 MED ORDER — HYDROXYZINE PAMOATE 50 MG PO CAPS: 50.0000 mg | ORAL_CAPSULE | Freq: Four times a day (QID) | ORAL | 1 refills | Status: DC | PRN

## 2016-07-30 MED ORDER — METOPROLOL SUCCINATE ER 50 MG PO TB24: ORAL_TABLET | ORAL | 1 refills | Status: DC
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
## 2016-07-30 NOTE — Patient Instructions (Signed)
Discussed continued walking program-work up to 30 minutes daily. We will call you with the lab results.

## 2017-01-12 ENCOUNTER — Other Ambulatory Visit: Payer: Self-pay | Admitting: Family Medicine

## 2017-01-12 DIAGNOSIS — I10 Essential (primary) hypertension: Secondary | ICD-10-CM

## 2017-01-13 ENCOUNTER — Encounter: Payer: Self-pay | Admitting: Family Medicine

## 2017-01-13 ENCOUNTER — Ambulatory Visit (INDEPENDENT_AMBULATORY_CARE_PROVIDER_SITE_OTHER): Payer: BC Managed Care – PPO | Admitting: Family Medicine

## 2017-01-13 VITALS — BP 120/60 | HR 77 | Temp 98.4°F | Resp 16 | Wt 260.0 lb

## 2017-01-13 DIAGNOSIS — B349 Viral infection, unspecified: Secondary | ICD-10-CM | POA: Diagnosis not present

## 2017-01-13 DIAGNOSIS — E7800 Pure hypercholesterolemia, unspecified: Secondary | ICD-10-CM | POA: Diagnosis not present

## 2017-01-13 DIAGNOSIS — I10 Essential (primary) hypertension: Secondary | ICD-10-CM | POA: Diagnosis not present

## 2017-01-13 MED ORDER — HYDROCODONE-HOMATROPINE 5-1.5 MG/5ML PO SYRP: ORAL_SOLUTION | ORAL | 0 refills | Status: DC

## 2017-01-13 MED ORDER — OSELTAMIVIR PHOSPHATE 75 MG PO CAPS: 75.0000 mg | ORAL_CAPSULE | Freq: Two times a day (BID) | ORAL | 0 refills | Status: DC

## 2017-01-13 NOTE — Progress Notes (Signed)
Subjective:     Patient ID: Reggie PileElizabeth E Hartman, female   DOB: 06/17/1966, 51 y.o.   MRN: [id number]  HPI  Chief Complaint  Patient presents with  . Cough    Patient comes in office today with concerns of cough and congestion for the past 6 weeks. Patient reports that cough is productive of thick green sputum and has been having bloody nasal drainage.   States she had a period of wellness this past month prior to the onset of these sx. New symptoms of sore throat, sinus congestion, productive cough, headache, and chills have begun in the last 24 hours. Reports exposure to flu from her grandchild. No flu shot this season.   Review of Systems     Objective:   Physical Exam  Constitutional: She appears well-developed and well-nourished. No distress.  Cardiovascular: Normal rate and regular rhythm.   Ears: T.M's intact without inflammation Throat: no tonsillar enlargement or exudate Neck: no cervical adenopathy Lungs: clear     Assessment:    1. Acute viral syndrome - HYDROcodone-homatropine (HYCODAN) 5-1.5 MG/5ML syrup; 5 ml 4-6 hours as needed for cough  Dispense: 240 mL; Refill: 0 - oseltamivir (TAMIFLU) 75 MG capsule; Take 1 capsule (75 mg total) by mouth 2 (two) times daily.  Dispense: 10 capsule; Refill: 0  2. Essential (primary) hypertension - Comprehensive metabolic panel  3. Hypercholesterolemia without hypertriglyceridemia - Lipid panel    Plan:     Discussed Mucinex D and Delsym. Start Tamiflu in the next 24 hours if body aches and fever. Work excuse for 2/1-01/13/17.

## 2017-01-13 NOTE — Patient Instructions (Addendum)
Discussed use of Mucinex D and Delsym for cough. If sinuses not getting better in a week call me.

## 2017-01-17 ENCOUNTER — Telehealth: Payer: Self-pay | Admitting: Family Medicine

## 2017-01-17 NOTE — Telephone Encounter (Signed)
Antibiotic not required at this time. Continue Mucinex D and let me know Thursday-Friday of this week if sinuses not improved.

## 2017-01-17 NOTE — Telephone Encounter (Signed)
Please review last office note and advise if there is any otc medication she an take for this matter or if antibiotic will be sent in. KW

## 2017-01-17 NOTE — Telephone Encounter (Signed)
Patient advised.KW 

## 2017-01-17 NOTE — Telephone Encounter (Signed)
Pt stated that she saw Nadine CountsBob for OV on 01/13/17 and the Tamaflu has helped with her fever and body aches but she still has a lot of sinus congestion. Pt is requesting an antibiotic sent to [facility] [address]. Pt stated that her work won't allow her to return to work until this is clear. Please advise. Thanks TNP

## 2017-02-07 ENCOUNTER — Other Ambulatory Visit: Payer: Self-pay | Admitting: Family Medicine

## 2017-02-07 DIAGNOSIS — F419 Anxiety disorder, unspecified: Secondary | ICD-10-CM

## 2017-02-11 ENCOUNTER — Other Ambulatory Visit: Payer: Self-pay | Admitting: Family Medicine

## 2017-02-14 ENCOUNTER — Ambulatory Visit (INDEPENDENT_AMBULATORY_CARE_PROVIDER_SITE_OTHER): Payer: BC Managed Care – PPO | Admitting: Family Medicine

## 2017-02-14 VITALS — BP 118/82 | HR 84 | Temp 98.0°F | Resp 16 | Wt 253.4 lb

## 2017-02-14 DIAGNOSIS — N644 Mastodynia: Secondary | ICD-10-CM | POA: Diagnosis not present

## 2017-02-14 NOTE — Progress Notes (Signed)
Subjective:     Patient ID: Mary PileElizabeth E Gerke, female   DOB: 07/21/1966, 51 y.o.   MRN: [id number]  HPI  Chief Complaint  Patient presents with  . Breast Pain    Patient comes in office today with complaints of right sided breast pain that has been intermittent since December.Patient denies any injury or trauma to chest, she describes pain as burning and sharp. Patient states pain was mainly around inner breast and now is moving acroos. Patient reports pain when moving and adjusting breast in bra, patient denies any breast changes or lumps in the past two months. Patient reports last mammogram was in 2017 and was normal.   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 Post menopausal. Denies hx of fibrocystic disease.   Review of Systems     Objective:   Physical Exam  Constitutional: She appears well-developed and well-nourished. No distress.  Cardiovascular: Normal rate and regular rhythm.   Pulmonary/Chest: Breath sounds normal.  Breasts: no axillary adenopathy, mass, nipple discharge or change in skin color/erythema. Tender at 3:00 on right breast.     Assessment:    1. Mastalgia - US BREAST COMPLETE UNI RIGHT INC AXILLA; Future    Plan:    Further f/u pending u/s result.

## 2017-02-14 NOTE — Patient Instructions (Signed)
We will call you about the time of the ultrasound or any other testing recommended. Don't forget to get your labs.

## 2017-02-16 ENCOUNTER — Telehealth: Payer: Self-pay

## 2017-02-16 LAB — LIPID PANEL
Chol/HDL Ratio: 6.9 ratio units — ABNORMAL HIGH (ref 0.0–4.4)
Cholesterol, Total: 263 mg/dL — ABNORMAL HIGH (ref 100–199)
HDL: 38 mg/dL — AB (ref >39)
LDL CALC: 176 mg/dL — AB (ref 0–99)
Triglycerides: 247 mg/dL — ABNORMAL HIGH (ref 0–149)
VLDL Cholesterol Cal: 49 mg/dL — ABNORMAL HIGH (ref 5–40)

## 2017-02-16 LAB — COMPREHENSIVE METABOLIC PANEL
ALBUMIN: 4.1 g/dL (ref 3.5–5.5)
ALT: 56 IU/L — ABNORMAL HIGH (ref 0–32)
AST: 37 IU/L (ref 0–40)
Albumin/Globulin Ratio: 1.5 (ref 1.2–2.2)
Alkaline Phosphatase: 86 IU/L (ref 39–117)
BUN / CREAT RATIO: 18 (ref 9–23)
BUN: 16 mg/dL (ref 6–24)
Bilirubin Total: 0.6 mg/dL (ref 0.0–1.2)
CO2: 25 mmol/L (ref 18–29)
Calcium: 9.1 mg/dL (ref 8.7–10.2)
Chloride: 100 mmol/L (ref 96–106)
Creatinine, Ser: 0.9 mg/dL (ref 0.57–1.00)
GFR calc Af Amer: 85 mL/min/{1.73_m2} (ref >59)
GFR calc non Af Amer: 74 mL/min/{1.73_m2} (ref >59)
GLUCOSE: 99 mg/dL (ref 65–99)
Globulin, Total: 2.7 g/dL (ref 1.5–4.5)
Potassium: 3.9 mmol/L (ref 3.5–5.2)
Sodium: 141 mmol/L (ref 134–144)
Total Protein: 6.8 g/dL (ref 6.0–8.5)

## 2017-02-16 NOTE — Telephone Encounter (Signed)
LMTCB-KW 

## 2017-02-16 NOTE — Telephone Encounter (Signed)
-----   Message from Anola Gurneyobert Chauvin, [place] sent at 02/16/2017  7:41 AM EST ----- Your sugar is ok. One liver test remains mildly elevated (would repeat in 3-6 months). Your cholesterol is elevated as you know. Your calculated 10 year risk for developing cardiovascular disease is low at 3.6%. We usually recommend cholesterol lowering drugs at 7.5 %

## 2017-02-17 NOTE — Telephone Encounter (Signed)
LMTCB-KW 

## 2017-02-18 NOTE — Telephone Encounter (Signed)
LMTCB-KW 

## 2017-02-18 NOTE — Telephone Encounter (Signed)
Pt advised-aa 

## 2017-02-23 ENCOUNTER — Inpatient Hospital Stay
Admission: RE | Admit: 2017-02-23 | Discharge: 2017-02-23 | Disposition: A | Discharge status: Home / Self Care | Payer: Self-pay | Source: Ambulatory Visit | Attending: *Deleted | Admitting: *Deleted

## 2017-02-23 ENCOUNTER — Other Ambulatory Visit: Payer: Self-pay | Admitting: *Deleted

## 2017-02-23 ENCOUNTER — Telehealth: Payer: Self-pay | Admitting: Family Medicine

## 2017-02-23 DIAGNOSIS — Z9289 Personal history of other medical treatment: Secondary | ICD-10-CM

## 2017-02-23 NOTE — Telephone Encounter (Signed)
Melissa from [facility] called stating that they have received  film from [facility] and they need an order for bilateral diagnostic mammogram,right and left breast LIMITED ultrasounds,Thanks.Call back # [phone]

## 2017-02-24 ENCOUNTER — Other Ambulatory Visit: Payer: Self-pay | Admitting: Family Medicine

## 2017-02-24 DIAGNOSIS — N644 Mastodynia: Secondary | ICD-10-CM

## 2017-02-24 NOTE — Telephone Encounter (Signed)
Orders entered

## 2017-03-04 ENCOUNTER — Other Ambulatory Visit: Payer: Self-pay | Admitting: Family Medicine

## 2017-03-04 ENCOUNTER — Ambulatory Visit
Admission: RE | Admit: 2017-03-04 | Discharge: 2017-03-04 | Disposition: A | Discharge status: Home / Self Care | Payer: BC Managed Care – PPO | Source: Ambulatory Visit | Attending: Family Medicine | Admitting: Family Medicine

## 2017-03-04 DIAGNOSIS — N644 Mastodynia: Secondary | ICD-10-CM

## 2017-05-16 ENCOUNTER — Ambulatory Visit: Payer: BC Managed Care – PPO | Admitting: Family Medicine

## 2017-05-31 ENCOUNTER — Other Ambulatory Visit: Payer: Self-pay | Admitting: Family Medicine

## 2017-05-31 ENCOUNTER — Telehealth: Payer: Self-pay | Admitting: Family Medicine

## 2017-05-31 DIAGNOSIS — F419 Anxiety disorder, unspecified: Secondary | ICD-10-CM

## 2017-05-31 MED ORDER — CITALOPRAM HYDROBROMIDE 20 MG PO TABS: 20.0000 mg | ORAL_TABLET | Freq: Every day | ORAL | 0 refills | Status: DC

## 2017-05-31 NOTE — Telephone Encounter (Signed)
Please review

## 2017-05-31 NOTE — Telephone Encounter (Signed)
LMTCB-KW 

## 2017-05-31 NOTE — Telephone Encounter (Signed)
Let her know I have sent in Citalopram doses

## 2017-05-31 NOTE — Telephone Encounter (Signed)
[place] in [place] and left her medication at home She needs her   citalopram (CELEXA) 20 MG tablet  Taking 02/07/17 -- Anola Gurneyhauvin, Robert, PA    TAKE ONE TABLET BY MOUTH ONCE DAILY   She only needs 1 weeks worth  [facility]   [facility] on [address] [place]   Pt's call back is [phone]

## 2017-06-03 NOTE — Telephone Encounter (Signed)
lmtcb-aa 

## 2017-06-07 NOTE — Telephone Encounter (Signed)
LMTCB-KW 

## 2017-07-26 ENCOUNTER — Other Ambulatory Visit: Payer: Self-pay | Admitting: Family Medicine

## 2017-07-26 DIAGNOSIS — I10 Essential (primary) hypertension: Secondary | ICD-10-CM

## 2017-07-26 DIAGNOSIS — F419 Anxiety disorder, unspecified: Secondary | ICD-10-CM

## 2018-01-27 ENCOUNTER — Other Ambulatory Visit: Payer: Self-pay | Admitting: Family Medicine

## 2018-01-27 DIAGNOSIS — I10 Essential (primary) hypertension: Secondary | ICD-10-CM

## 2018-03-11 ENCOUNTER — Other Ambulatory Visit: Payer: Self-pay | Admitting: Family Medicine

## 2018-03-11 DIAGNOSIS — F419 Anxiety disorder, unspecified: Secondary | ICD-10-CM

## 2018-05-04 ENCOUNTER — Telehealth: Payer: Self-pay | Admitting: Family Medicine

## 2018-05-04 NOTE — Telephone Encounter (Signed)
Pt needs a refill   Metoprolol   Hydrochlorothiazide 12.[address]  [facility] [address]   [employer]

## 2018-05-05 ENCOUNTER — Other Ambulatory Visit: Payer: Self-pay | Admitting: Family Medicine

## 2018-05-05 DIAGNOSIS — I10 Essential (primary) hypertension: Secondary | ICD-10-CM

## 2018-05-05 MED ORDER — METOPROLOL SUCCINATE ER 50 MG PO TB24: ORAL_TABLET | ORAL | 0 refills | Status: DC

## 2018-05-05 MED ORDER — HYDROCHLOROTHIAZIDE 12.5 MG PO CAPS: ORAL_CAPSULE | ORAL | 0 refills | Status: DC

## 2018-05-05 NOTE — Telephone Encounter (Signed)
done

## 2018-05-05 NOTE — Telephone Encounter (Signed)
Last OV 01/13/2017  Last refills Metroprolol- 01/29/18, HCTZ- 01/31/18 No new OV at this time

## 2018-06-08 ENCOUNTER — Other Ambulatory Visit: Payer: Self-pay

## 2018-06-08 DIAGNOSIS — F419 Anxiety disorder, unspecified: Secondary | ICD-10-CM

## 2018-06-08 MED ORDER — CITALOPRAM HYDROBROMIDE 20 MG PO TABS: 20.0000 mg | ORAL_TABLET | Freq: Every day | ORAL | 0 refills | Status: DC

## 2018-07-31 ENCOUNTER — Encounter: Payer: Self-pay | Admitting: Family Medicine

## 2018-07-31 ENCOUNTER — Ambulatory Visit: Payer: BC Managed Care – PPO | Admitting: Family Medicine

## 2018-07-31 VITALS — BP 130/84 | HR 77 | Temp 98.1°F | Resp 16 | Ht 65.0 in | Wt 252.6 lb

## 2018-07-31 DIAGNOSIS — I10 Essential (primary) hypertension: Secondary | ICD-10-CM | POA: Diagnosis not present

## 2018-07-31 DIAGNOSIS — Z0000 Encounter for general adult medical examination without abnormal findings: Secondary | ICD-10-CM

## 2018-07-31 DIAGNOSIS — E7800 Pure hypercholesterolemia, unspecified: Secondary | ICD-10-CM

## 2018-07-31 DIAGNOSIS — R413 Other amnesia: Secondary | ICD-10-CM

## 2018-07-31 DIAGNOSIS — F419 Anxiety disorder, unspecified: Secondary | ICD-10-CM

## 2018-07-31 DIAGNOSIS — E559 Vitamin D deficiency, unspecified: Secondary | ICD-10-CM

## 2018-07-31 MED ORDER — CITALOPRAM HYDROBROMIDE 20 MG PO TABS: 20.0000 mg | ORAL_TABLET | Freq: Every day | ORAL | 3 refills | Status: DC

## 2018-07-31 MED ORDER — HYDROCHLOROTHIAZIDE 12.5 MG PO CAPS: ORAL_CAPSULE | ORAL | 3 refills | Status: DC

## 2018-07-31 MED ORDER — METOPROLOL SUCCINATE ER 50 MG PO TB24: ORAL_TABLET | ORAL | 3 refills | Status: DC

## 2018-07-31 MED ORDER — HYDROXYZINE PAMOATE 50 MG PO CAPS: ORAL_CAPSULE | ORAL | 1 refills | Status: AC

## 2018-07-31 NOTE — Progress Notes (Signed)
  Subjective:     Patient ID: Mary Villegas, female   DOB: 07/27/1966, 52 y.o.   MRN: [id number] Chief Complaint  Patient presents with  . Hypertension    Patient returns to office today for follow up, last office visit was 01/13/17 and blood pressure was 120/60.  Patient reports good compliance and tolerance on medication.  Marland Kitchen. Anxiety    Patient returns to office today for follow up up visit from 07/30/16. Patient reports good symptom control and compliance on Celexa 20mg  and Hydroxyzine 50mg . Patient states that she has started taking CBD gummies to help control symptoms and she states that symtoms of anxiety/depression are improving.  . Memory Loss    Patient would like to address short term memory loss, this has been affecting the patient for over a year.    HPI States memory issues occur in the setting of increased anxiety-specifically related to extra work duties at her second job with [employer]. Does not forget names, birthdays, or food on the stove. Does not get lost driving or have trouble with numbers. States she wishes to get gyn health maintenance with [facility]. Currently uses hydroxyzine primarily at night "to turn off my mind." Review of Systems     Objective:   Physical Exam  Constitutional: She appears well-developed and well-nourished. No distress.  Cardiovascular: Normal rate and regular rhythm.  Pulmonary/Chest: Breath sounds normal.  Musculoskeletal: She exhibits no edema (of lower extremities ).       Assessment:    1. Essential (primary) hypertension - Comprehensive metabolic panel - metoprolol succinate (TOPROL-XL) 50 MG 24 hr tablet; TAKE 1 TABLET BY MOUTH ONCE DAILY  Dispense: 90 tablet; Refill: 3 - hydrochlorothiazide (MICROZIDE) 12.5 MG capsule; TAKE 1 CAPSULE BY MOUTH ONCE DAILY. *TIME FOR OFFICE VISIT*  Dispense: 90 capsule; Refill: 3  2. Hypercholesterolemia without hypertriglyceridemia - Lipid panel  3. Acute anxiety - hydrOXYzine (VISTARIL) 50 MG capsule;  One daily as needed for anxiety  Dispense: 30 capsule; Refill: 1 - citalopram (CELEXA) 20 MG tablet; Take 1 tablet (20 mg total) by mouth daily.  Dispense: 90 tablet; Refill: 3  4. Memory difficulties - TSH - T4, free - B12 - Folate  5. Healthcare maintenance - Ambulatory referral to Gynecology  6. Avitaminosis D - VITAMIN D 25 Hydroxy (Vit-D Deficiency, Fractures)    Plan:   Further f/u pending lab work.

## 2018-07-31 NOTE — Patient Instructions (Signed)
We will call with the lab results. Let me know if you wish a referral again to the foot doctor.

## 2018-08-02 ENCOUNTER — Other Ambulatory Visit: Payer: Self-pay | Admitting: Family Medicine

## 2018-08-03 ENCOUNTER — Telehealth: Payer: Self-pay

## 2018-08-03 LAB — COMPREHENSIVE METABOLIC PANEL
ALK PHOS: 99 IU/L (ref 39–117)
ALT: 30 IU/L (ref 0–32)
AST: 23 IU/L (ref 0–40)
Albumin/Globulin Ratio: 1.5 (ref 1.2–2.2)
Albumin: 4.2 g/dL (ref 3.5–5.5)
BUN/Creatinine Ratio: 12 (ref 9–23)
BUN: 14 mg/dL (ref 6–24)
Bilirubin Total: 0.5 mg/dL (ref 0.0–1.2)
CO2: 23 mmol/L (ref 20–29)
CREATININE: 1.19 mg/dL — AB (ref 0.57–1.00)
Calcium: 9.2 mg/dL (ref 8.7–10.2)
Chloride: 98 mmol/L (ref 96–106)
GFR calc Af Amer: 60 mL/min/{1.73_m2} (ref >59)
GFR calc non Af Amer: 52 mL/min/{1.73_m2} — ABNORMAL LOW (ref >59)
GLUCOSE: 103 mg/dL — AB (ref 65–99)
Globulin, Total: 2.8 g/dL (ref 1.5–4.5)
Potassium: 4.2 mmol/L (ref 3.5–5.2)
Sodium: 137 mmol/L (ref 134–144)
Total Protein: 7 g/dL (ref 6.0–8.5)

## 2018-08-03 LAB — LIPID PANEL
CHOLESTEROL TOTAL: 269 mg/dL — AB (ref 100–199)
Chol/HDL Ratio: 7.7 ratio — ABNORMAL HIGH (ref 0.0–4.4)
HDL: 35 mg/dL — ABNORMAL LOW (ref >39)
LDL CALC: 182 mg/dL — AB (ref 0–99)
Triglycerides: 259 mg/dL — ABNORMAL HIGH (ref 0–149)
VLDL Cholesterol Cal: 52 mg/dL — ABNORMAL HIGH (ref 5–40)

## 2018-08-03 LAB — TSH: TSH: 1.11 u[IU]/mL (ref 0.450–4.500)

## 2018-08-03 LAB — T4, FREE: Free T4: 1.27 ng/dL (ref 0.82–1.77)

## 2018-08-03 LAB — VITAMIN D 25 HYDROXY (VIT D DEFICIENCY, FRACTURES): Vit D, 25-Hydroxy: 44.7 ng/mL (ref 30.0–100.0)

## 2018-08-03 LAB — VITAMIN B12: VITAMIN B 12: 630 pg/mL (ref 232–1245)

## 2018-08-03 LAB — FOLATE: Folate: 15.8 ng/mL (ref >3.0)

## 2018-08-03 NOTE — Telephone Encounter (Signed)
LMTCB  Thanks,  -Joseline 

## 2018-08-03 NOTE — Telephone Encounter (Signed)
-----   Message from Anola Gurneyobert Chauvin, [place] sent at 08/03/2018  7:35 AM EDT ----- Labs look ok except for mild elevation in sugar and elevated cholesterol. Your calculated 10 year risk for developing cardiovascular disease is 5.4%. This risk goes up with age. We usually will recommend a cholesterol lowering drug at 7.5% risk. I would recommend regular exercise 30 minutes daily and changing to a Mediterranean type diet (you can get this off the internet or I can print a handout). This will also help your sugar.

## 2018-08-04 NOTE — Telephone Encounter (Signed)
Patient advised as directed below.  Thanks,  -Omah Dewalt 

## 2018-08-04 NOTE — Telephone Encounter (Signed)
Pt returned call. Pt stated she works 2 jobs so if she doesn't answer the next we call to please leave the results on her voicemail. Please advise. Thanks TNP

## 2018-09-15 ENCOUNTER — Encounter: Payer: Self-pay | Admitting: Family Medicine

## 2018-09-15 ENCOUNTER — Ambulatory Visit: Payer: BC Managed Care – PPO | Admitting: Family Medicine

## 2018-09-15 ENCOUNTER — Other Ambulatory Visit: Payer: Self-pay

## 2018-09-15 VITALS — BP 140/86 | HR 81 | Temp 98.5°F | Ht 65.0 in | Wt 255.8 lb

## 2018-09-15 DIAGNOSIS — B349 Viral infection, unspecified: Secondary | ICD-10-CM | POA: Diagnosis not present

## 2018-09-15 NOTE — Progress Notes (Signed)
  Subjective:     Patient ID: Mary Villegas, female   DOB: [birth year], 52 y.o.   MRN: [id number] Chief Complaint  Patient presents with  . Nasal Congestion    pt reports drainage in throat, fever, headache, chills,ear pain, throat scratchy, sweats that started on 09/08/18 and on wed symptoms got worse     HPI Reports feeling feverish but did not take her temp. PND fueling the cough. No body aches but + chills. States Tylenol cold preparation helping her symptoms. Needs work excuses for two jobs.  Review of Systems     Objective:   Physical Exam  Constitutional: She appears well-developed and well-nourished. No distress.  Ears: T.M's intact without inflammation Throat: no tonsillar enlargement or exudate Neck: no cervical adenopathy Lungs: clear     Assessment:    1. Viral syndrome    Plan:    Discussed Delsym for cough as needed and continue current otc medication. 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

## 2018-09-15 NOTE — Patient Instructions (Signed)
Continue Tylenol cold preparation. Add Delsym for cough. Let me know if sinuses not improving.

## 2018-09-30 ENCOUNTER — Ambulatory Visit (INDEPENDENT_AMBULATORY_CARE_PROVIDER_SITE_OTHER): Payer: BC Managed Care – PPO | Admitting: Family Medicine

## 2018-09-30 VITALS — BP 146/87 | HR 84 | Temp 98.0°F | Resp 16 | Wt 257.0 lb

## 2018-09-30 DIAGNOSIS — H109 Unspecified conjunctivitis: Secondary | ICD-10-CM

## 2018-09-30 MED ORDER — OFLOXACIN 0.3 % OP SOLN: 2.0000 [drp] | Freq: Four times a day (QID) | OPHTHALMIC | 0 refills | Status: AC

## 2018-09-30 NOTE — Progress Notes (Signed)
Patient: Mary Villegas Female    DOB: 02-Feb-1966   52 y.o.   MRN: [id number] Visit Date: 09/30/2018  Today's Provider: Shirlee Latch, MD   Chief Complaint  Patient presents with  . Eye Drainage    x 2 days   Subjective:    HPI Eye Drainage:  Patient comes in today complaining of eye redness and drainage of the left eye. This started 2 days ago and has progressively worsened. Patient also complains of pain in the left eye.  She is a contact lens wearer and has worn contacts during this episode.  She has no h/o iritis, eye trauma, autoimmune disease.     Allergies  Allergen Reactions  . Atorvastatin     Other reaction(s): Muscle Pain  . Lovastatin     myalgias  . Pravastatin Sodium     myalgias  . Simvastatin     Causes anxiety     Current Outpatient Medications:  .  cholecalciferol (VITAMIN D) 1000 UNITS tablet, Take 1 tablet by mouth daily., Disp: , Rfl:  .  citalopram (CELEXA) 20 MG tablet, Take 1 tablet (20 mg total) by mouth daily., Disp: 90 tablet, Rfl: 3 .  hydrochlorothiazide (MICROZIDE) 12.5 MG capsule, TAKE 1 CAPSULE BY MOUTH ONCE DAILY. *TIME FOR OFFICE VISIT*, Disp: 90 capsule, Rfl: 3 .  hydrOXYzine (VISTARIL) 50 MG capsule, One daily as needed for anxiety, Disp: 30 capsule, Rfl: 1 .  MAGNESIUM PO, Take 1 tablet by mouth daily., Disp: , Rfl:  .  metoprolol succinate (TOPROL-XL) 50 MG 24 hr tablet, TAKE 1 TABLET BY MOUTH ONCE DAILY, Disp: 90 tablet, Rfl: 3  Review of Systems  Constitutional: Negative for appetite change, chills, fatigue and fever.  Eyes: Positive for pain, discharge, redness and itching.  Respiratory: Negative for chest tightness and shortness of breath.   Cardiovascular: Negative for chest pain and palpitations.  Gastrointestinal: Negative for abdominal pain, nausea and vomiting.  Neurological: Negative for dizziness and weakness.    Social History   Tobacco Use  . Smoking status: Former Smoker    Packs/day: 0.50   Years: 20.00    Pack years: 10.00    Types: Cigarettes  . Smokeless tobacco: Never Used  . Tobacco comment: QUIT IN 2005  Substance Use Topics  . Alcohol use: No    Alcohol/week: 0.0 standard drinks   Objective:   BP (!) 146/87 (BP Location: Right Arm, Patient Position: Sitting, Cuff Size: Large)   Pulse 84   Temp 98 F (36.7 C) (Oral)   Resp 16   Wt 257 lb (116.6 kg)   LMP  (LMP Unknown)   SpO2 98% Comment: room air  BMI 42.77 kg/m  Vitals:   09/30/18 0913  BP: (!) 146/87  Pulse: 84  Resp: 16  Temp: 98 F (36.7 C)  TempSrc: Oral  SpO2: 98%  Weight: 257 lb (116.6 kg)     Physical Exam  Constitutional: She is oriented to person, place, and time. She appears well-developed and well-nourished. No distress.  HENT:  Head: Normocephalic and atraumatic.  Right Ear: External ear normal.  Left Ear: External ear normal.  Nose: Nose normal.  Mouth/Throat: Oropharynx is clear and moist. No oropharyngeal exudate.  Eyes: Pupils are equal, round, and reactive to light. EOM and lids are normal. Right eye exhibits no discharge. No foreign body present in the right eye. Left eye exhibits discharge. No foreign body present in the left eye. Right conjunctiva is not injected.  Left conjunctiva is injected.  Neck: Neck supple. No thyromegaly present.  Cardiovascular: Normal rate and regular rhythm.  Pulmonary/Chest: Effort normal. No respiratory distress.  Musculoskeletal: She exhibits no edema.  Lymphadenopathy:    She has no cervical adenopathy.  Neurological: She is alert and oriented to person, place, and time.  Skin: Skin is warm and dry. Capillary refill takes less than 2 seconds. No rash noted.  Psychiatric: She has a normal mood and affect. Her behavior is normal.  Vitals reviewed.       Assessment & Plan:   1. Bacterial conjunctivitis of left eye - new problem - given eye pain and photophobia, some concern for possible iritis, but otherwise symptoms are c/w  conjunctivitis - no signs/symptoms of corneal abrasion/foreign body - she does not have pre-disposing factors for iritis, but is at increased risk of conjunctivitis as a contact lens wearer - discussed red flags and reason to call her ophthalmologist - will treat with 7d course of Ofloxacin - avoid contacts until resolved   Meds ordered this encounter  Medications  . ofloxacin (OCUFLOX) 0.3 % ophthalmic solution    Sig: Place 2 drops into the left eye 4 (four) times daily for 7 days.    Dispense:  5 mL    Refill:  0     Return if symptoms worsen or fail to improve.   The entirety of the information documented in the History of Present Illness, Review of Systems and Physical Exam were personally obtained by me. Portions of this information were initially documented by Awilda Bill, CMA and reviewed by me for thoroughness and accuracy.    Erasmo Downer, MD, MPH [facility] 09/30/2018 9:31 AM

## 2018-09-30 NOTE — Patient Instructions (Signed)

## 2019-08-31 ENCOUNTER — Encounter: Payer: Self-pay | Admitting: Family Medicine

## 2019-08-31 ENCOUNTER — Other Ambulatory Visit: Payer: Self-pay

## 2019-08-31 ENCOUNTER — Ambulatory Visit (INDEPENDENT_AMBULATORY_CARE_PROVIDER_SITE_OTHER): Payer: BC Managed Care – PPO | Admitting: Family Medicine

## 2019-08-31 VITALS — BP 129/67 | HR 94 | Temp 97.3°F | Wt 248.4 lb

## 2019-08-31 DIAGNOSIS — F331 Major depressive disorder, recurrent, moderate: Secondary | ICD-10-CM | POA: Insufficient documentation

## 2019-08-31 DIAGNOSIS — E559 Vitamin D deficiency, unspecified: Secondary | ICD-10-CM | POA: Diagnosis not present

## 2019-08-31 DIAGNOSIS — Z1159 Encounter for screening for other viral diseases: Secondary | ICD-10-CM

## 2019-08-31 DIAGNOSIS — F411 Generalized anxiety disorder: Secondary | ICD-10-CM

## 2019-08-31 DIAGNOSIS — Z1211 Encounter for screening for malignant neoplasm of colon: Secondary | ICD-10-CM | POA: Diagnosis not present

## 2019-08-31 DIAGNOSIS — Z114 Encounter for screening for human immunodeficiency virus [HIV]: Secondary | ICD-10-CM | POA: Diagnosis not present

## 2019-08-31 DIAGNOSIS — I10 Essential (primary) hypertension: Secondary | ICD-10-CM

## 2019-08-31 DIAGNOSIS — Z1239 Encounter for other screening for malignant neoplasm of breast: Secondary | ICD-10-CM

## 2019-08-31 MED ORDER — CITALOPRAM HYDROBROMIDE 40 MG PO TABS: 40.0000 mg | ORAL_TABLET | Freq: Every day | ORAL | 1 refills | Status: DC

## 2019-08-31 MED ORDER — METOPROLOL SUCCINATE ER 50 MG PO TB24: ORAL_TABLET | ORAL | 3 refills | Status: DC

## 2019-08-31 MED ORDER — HYDROCHLOROTHIAZIDE 12.5 MG PO CAPS: 12.5000 mg | ORAL_CAPSULE | Freq: Every day | ORAL | 3 refills | Status: DC

## 2019-08-31 NOTE — Progress Notes (Signed)
Patient: Mary Villegas Female    DOB: 02/02/1966   53 y.o.   MRN: [id number] Visit Date: 08/31/2019  Today's Provider: Shirlee LatchAngela Chantilly Linskey, MD   Chief Complaint  Patient presents with  . Hypertension  . Anxiety   Subjective:    I, Porsha McClurkin CMA, am acting as a scribe for Shirlee LatchAngela Analucia Hush, MD.   HPI  Hypertension, follow-up:  BP Readings from Last 3 Encounters:  08/31/19 129/67  09/30/18 (!) 146/87  09/15/18 140/86    She was last seen for hypertension 1 years ago.  BP at that visit was 130/84. Management changes since that visit include continue taking HTCZ & Toprol-XL. She reports good compliance with treatment. She is not having side effects.  She is exercising. She is adherent to low salt diet.   Outside blood pressures are not checked. She is experiencing none.  Cardiovascular risk factors include hypertension.  Use of agents associated with hypertension: none.     Weight trend: decreasing steadily Wt Readings from Last 3 Encounters:  08/31/19 248 lb 6.4 oz (112.7 kg)  09/30/18 257 lb (116.6 kg)  09/15/18 255 lb 12.8 oz (116 kg)    Current diet: well balanced  ------------------------------------------------------------------------  Anxiety& Depression Patient presents today for anxiety & depression follow-up. Patient last office visit was 07/31/2018.Patient is currently taking Celexa 20mg  & Hydroxyzine 50mg .  She has episodes of rumination at night due to anxiety. Feels like Celexa helps, but still having a lot of anxiety  GAD 7 : Generalized Anxiety Score 08/31/2019  Nervous, Anxious, on Edge 2  Control/stop worrying 3  Worry too much - different things 3  Trouble relaxing 2  Restless 2  Easily annoyed or irritable 3  Afraid - awful might happen 2  Total GAD 7 Score 17  Anxiety Difficulty Somewhat difficult    Depression screen PHQ 2/9 08/31/2019  Decreased Interest 1  Down, Depressed, Hopeless 1  PHQ - 2 Score 2  Altered sleeping  3  Tired, decreased energy 3  Change in appetite 0  Feeling bad or failure about yourself  1  Trouble concentrating 3  Moving slowly or fidgety/restless 1  Suicidal thoughts 0  PHQ-9 Score 13  Difficult doing work/chores Somewhat difficult     Allergies  Allergen Reactions  . Atorvastatin     Other reaction(s): Muscle Pain  . Lovastatin     myalgias  . Pravastatin Sodium     myalgias  . Simvastatin     Causes anxiety     Current Outpatient Medications:  .  cholecalciferol (VITAMIN D) 1000 UNITS tablet, Take 1 tablet by mouth daily., Disp: , Rfl:  .  citalopram (CELEXA) 20 MG tablet, Take 1 tablet (20 mg total) by mouth daily., Disp: 90 tablet, Rfl: 3 .  hydrochlorothiazide (MICROZIDE) 12.5 MG capsule, TAKE 1 CAPSULE BY MOUTH ONCE DAILY. *TIME FOR OFFICE VISIT*, Disp: 90 capsule, Rfl: 3 .  hydrOXYzine (VISTARIL) 50 MG capsule, One daily as needed for anxiety, Disp: 30 capsule, Rfl: 1 .  MAGNESIUM PO, Take 1 tablet by mouth daily., Disp: , Rfl:  .  metoprolol succinate (TOPROL-XL) 50 MG 24 hr tablet, TAKE 1 TABLET BY MOUTH ONCE DAILY, Disp: 90 tablet, Rfl: 3  Review of Systems  Constitutional: Negative.   HENT: Negative.   Respiratory: Negative.   Cardiovascular: Negative.   Genitourinary: Negative.   Skin: Negative.   Neurological: Negative.   Psychiatric/Behavioral: Negative.     Social History   Tobacco Use  .  Smoking status: Former Smoker    Packs/day: 0.50    Years: 20.00    Pack years: 10.00    Types: Cigarettes  . Smokeless tobacco: Never Used  . Tobacco comment: QUIT IN 2005  Substance Use Topics  . Alcohol use: No    Alcohol/week: 0.0 standard drinks      Objective:   BP 129/67 (BP Location: Left Arm, Patient Position: Sitting, Cuff Size: Large)   Pulse 94   Temp (!) 97.3 F (36.3 C) (Temporal)   Wt 248 lb 6.4 oz (112.7 kg)   LMP  (LMP Unknown)   SpO2 96%   BMI 41.34 kg/m  Vitals:   08/31/19 0911  BP: 129/67  Pulse: 94  Temp: (!) 97.3  F (36.3 C)  TempSrc: Temporal  SpO2: 96%  Weight: 248 lb 6.4 oz (112.7 kg)  Body mass index is 41.34 kg/m.   Physical Exam Vitals signs reviewed.  Constitutional:      General: She is not in acute distress.    Appearance: Normal appearance. She is well-developed. She is not diaphoretic.  HENT:     Head: Normocephalic and atraumatic.  Eyes:     General: No scleral icterus.    Conjunctiva/sclera: Conjunctivae normal.  Neck:     Musculoskeletal: Neck supple.     Thyroid: No thyromegaly.  Cardiovascular:     Rate and Rhythm: Normal rate and regular rhythm.     Pulses: Normal pulses.     Heart sounds: Normal heart sounds. No murmur.  Pulmonary:     Effort: Pulmonary effort is normal. No respiratory distress.     Breath sounds: Normal breath sounds. No wheezing, rhonchi or rales.  Musculoskeletal:     Right lower leg: No edema.     Left lower leg: No edema.  Lymphadenopathy:     Cervical: No cervical adenopathy.  Skin:    General: Skin is warm and dry.     Capillary Refill: Capillary refill takes less than 2 seconds.     Findings: No rash.  Neurological:     Mental Status: She is alert and oriented to person, place, and time. Mental status is at baseline.  Psychiatric:        Mood and Affect: Mood normal.        Behavior: Behavior normal.      No results found for any visits on 08/31/19.     Assessment & Plan   Problem List Items Addressed This Visit      Cardiovascular and Mediastinum   Essential (primary) hypertension - Primary    Well controlled Continue current medications Recheck metabolic panel F/u in 6 months       Relevant Medications   hydrochlorothiazide (MICROZIDE) 12.5 MG capsule   metoprolol succinate (TOPROL-XL) 50 MG 24 hr tablet   Other Relevant Orders   Comprehensive metabolic panel     Other   Avitaminosis D    Recheck Vit D level      Relevant Orders   VITAMIN D 25 Hydroxy (Vit-D Deficiency, Fractures)   Moderate episode of  recurrent major depressive disorder (HCC)    Chronic Uncontrolled Increase celexa to 40mg  daily F/u in 3 months Encouraged therapy      Relevant Medications   citalopram (CELEXA) 40 MG tablet   GAD (generalized anxiety disorder)    Chronic Uncontrolled Increase celexa to 40mg  daily F/u in 3 months Encouraged therapy      Relevant Medications   citalopram (CELEXA) 40 MG tablet  Morbid obesity (HCC)    Discussed importance of healthy weight management Discussed diet and exercise       Relevant Orders   Comprehensive metabolic panel   Lipid panel    Other Visit Diagnoses    Screening for breast cancer       Relevant Orders   MM 3D SCREEN BREAST BILATERAL   Screen for colon cancer       Relevant Orders   Cologuard   Screening for HIV (human immunodeficiency virus)       Relevant Orders   HIV antibody (with reflex)   Need for hepatitis C screening test       Relevant Orders   Hepatitis C Antibody       Return in about 3 months (around 11/30/2019) for CPE.   The entirety of the information documented in the History of Present Illness, Review of Systems and Physical Exam were personally obtained by me. Portions of this information were initially documented by [facility], CMA and reviewed by me for thoroughness and accuracy.    Jeno Calleros, Marzella Schlein, MD MPH [facility]

## 2019-08-31 NOTE — Assessment & Plan Note (Signed)
Recheck Vit D level 

## 2019-08-31 NOTE — Assessment & Plan Note (Signed)
Chronic Uncontrolled Increase celexa to 40mg  daily F/u in 3 months Encouraged therapy

## 2019-08-31 NOTE — Assessment & Plan Note (Signed)
Chronic Uncontrolled Increase celexa to 40mg daily F/u in 3 months Encouraged therapy 

## 2019-08-31 NOTE — Assessment & Plan Note (Signed)
Discussed importance of healthy weight management Discussed diet and exercise  

## 2019-08-31 NOTE — Assessment & Plan Note (Signed)
Well controlled Continue current medications Recheck metabolic panel F/u in 6 months  

## 2019-09-01 LAB — HEPATITIS C ANTIBODY: Hep C Virus Ab: <0.1 s/co ratio (ref 0.0–0.9)

## 2019-09-01 LAB — COMPREHENSIVE METABOLIC PANEL
ALT: 26 IU/L (ref 0–32)
AST: 21 IU/L (ref 0–40)
Albumin/Globulin Ratio: 1.6 (ref 1.2–2.2)
Albumin: 4 g/dL (ref 3.8–4.9)
Alkaline Phosphatase: 90 IU/L (ref 39–117)
BUN/Creatinine Ratio: 13 (ref 9–23)
BUN: 15 mg/dL (ref 6–24)
Bilirubin Total: 0.4 mg/dL (ref 0.0–1.2)
CO2: 23 mmol/L (ref 20–29)
Calcium: 9.2 mg/dL (ref 8.7–10.2)
Chloride: 100 mmol/L (ref 96–106)
Creatinine, Ser: 1.17 mg/dL — ABNORMAL HIGH (ref 0.57–1.00)
GFR calc Af Amer: 61 mL/min/{1.73_m2} (ref >59)
GFR calc non Af Amer: 53 mL/min/{1.73_m2} — ABNORMAL LOW (ref >59)
Globulin, Total: 2.5 g/dL (ref 1.5–4.5)
Glucose: 119 mg/dL — ABNORMAL HIGH (ref 65–99)
Potassium: 4.1 mmol/L (ref 3.5–5.2)
Sodium: 138 mmol/L (ref 134–144)
Total Protein: 6.5 g/dL (ref 6.0–8.5)

## 2019-09-01 LAB — VITAMIN D 25 HYDROXY (VIT D DEFICIENCY, FRACTURES): Vit D, 25-Hydroxy: 28.2 ng/mL — ABNORMAL LOW (ref 30.0–100.0)

## 2019-09-01 LAB — LIPID PANEL
Chol/HDL Ratio: 7 ratio — ABNORMAL HIGH (ref 0.0–4.4)
Cholesterol, Total: 260 mg/dL — ABNORMAL HIGH (ref 100–199)
HDL: 37 mg/dL — ABNORMAL LOW (ref >39)
LDL Chol Calc (NIH): 171 mg/dL — ABNORMAL HIGH (ref 0–99)
Triglycerides: 274 mg/dL — ABNORMAL HIGH (ref 0–149)
VLDL Cholesterol Cal: 52 mg/dL — ABNORMAL HIGH (ref 5–40)

## 2019-09-01 LAB — HIV ANTIBODY (ROUTINE TESTING W REFLEX): HIV Screen 4th Generation wRfx: NONREACTIVE

## 2019-09-03 ENCOUNTER — Telehealth: Payer: Self-pay

## 2019-09-03 NOTE — Telephone Encounter (Signed)
Patient was advised of labs results. Patient states that she will like to wait for her CPE appointment in December to have labs rechecked because she had just drunk a cup of coffee with sugar in it. Patient states that she have tried statin drugs like 10 or 12 years ago and it only caused her knee pain.FYI

## 2019-09-03 NOTE — Telephone Encounter (Signed)
-----   Message from Virginia Crews, MD sent at 09/03/2019  9:37 AM EDT ----- Stable kidney function.  Normal electrolytes and liver function.  Negative hepatitis C and HIV screening.  Vitamin D level is slightly low.  Recommend supplementation with over-the-counter vitamin D3 1000 to 2000 units daily. Cholesterol is high.  10 year risk of heart disease/stroke is borderline at 5.2%. I recommend diet low in saturated fat and regular exercise - 30 min at least 5 times per week.  Would recheck at next visit.  If still elevated, will consider medication to decrease heart disease and stroke risk.  Blood sugar is also elevated.  This may be normal for the patient was not fasting at time labs were drawn.  We could also try to add on an A1c, or get it with her next labs at her next visit.

## 2019-10-17 ENCOUNTER — Telehealth: Payer: Self-pay

## 2019-10-17 NOTE — Telephone Encounter (Signed)
Patient called office requesting that Dr. Brita Romp call her in a prescription for skin irritation. When I asked patient for more detail she states that she has had itching of the skin for the past 2-3 weeks and has been applying tea tree oil with no relief. Patient denied changes in skin care products, soaps, detergents, food or change in recent medications. I suggested patient schedule a virtual visit to address but she said she wanted to see if Dr. Brita Romp could call her in something first. Patient states that she would like to speak with CMA further because she believes that she has been exposed to scabies. Patient request call back at [phone], she says that [facility] [address] is her pharmacy. KW

## 2019-10-17 NOTE — Telephone Encounter (Signed)
Apt made for 10/18/2019 at 10am.   Thanks,   -Mickel Baas

## 2019-10-18 ENCOUNTER — Other Ambulatory Visit: Payer: Self-pay

## 2019-10-18 ENCOUNTER — Ambulatory Visit (INDEPENDENT_AMBULATORY_CARE_PROVIDER_SITE_OTHER): Payer: BC Managed Care – PPO | Admitting: Family Medicine

## 2019-10-18 ENCOUNTER — Encounter: Payer: Self-pay | Admitting: Family Medicine

## 2019-10-18 VITALS — BP 132/84 | HR 76 | Temp 96.8°F | Wt 245.0 lb

## 2019-10-18 DIAGNOSIS — L739 Follicular disorder, unspecified: Secondary | ICD-10-CM

## 2019-10-18 DIAGNOSIS — L853 Xerosis cutis: Secondary | ICD-10-CM

## 2019-10-18 DIAGNOSIS — L299 Pruritus, unspecified: Secondary | ICD-10-CM

## 2019-10-18 MED ORDER — MUPIROCIN 2 % EX OINT: 1.0000 "application " | TOPICAL_OINTMENT | Freq: Two times a day (BID) | CUTANEOUS | 0 refills | Status: DC

## 2019-10-18 NOTE — Progress Notes (Signed)
Patient: Mary Villegas Female    DOB: March 04, 1966   53 y.o.   MRN: [id number] Visit Date: 10/18/2019  Today's Provider: Lavon Paganini, MD   Chief Complaint  Patient presents with  . Rash   Subjective:     Rash This is a new problem. The current episode started 1 to 4 weeks ago. The problem has been gradually worsening since onset. The rash is diffuse. The rash is characterized by itchiness (Wose at night.). She was exposed to nothing.   Itching all over . Scratching worse at night and when calm during the day  Going on for about 2 weeks.  Has a new sexual partner and worried about whether this is related.  Worried that she may have scabies.  She denies any scabies in anyone in her home or in her new sexual partner.  She denies any genital rash or vaginal discharge.   Allergies  Allergen Reactions  . Atorvastatin     Other reaction(s): Muscle Pain  . Lovastatin     myalgias  . Pravastatin Sodium     myalgias  . Simvastatin     Causes anxiety     Current Outpatient Medications:  .  cholecalciferol (VITAMIN D) 1000 UNITS tablet, Take 1 tablet by mouth daily., Disp: , Rfl:  .  citalopram (CELEXA) 40 MG tablet, Take 1 tablet (40 mg total) by mouth daily., Disp: 90 tablet, Rfl: 1 .  hydrochlorothiazide (MICROZIDE) 12.5 MG capsule, Take 1 capsule (12.5 mg total) by mouth daily., Disp: 90 capsule, Rfl: 3 .  hydrOXYzine (VISTARIL) 50 MG capsule, One daily as needed for anxiety, Disp: 30 capsule, Rfl: 1 .  MAGNESIUM PO, Take 1 tablet by mouth daily., Disp: , Rfl:  .  metoprolol succinate (TOPROL-XL) 50 MG 24 hr tablet, TAKE 1 TABLET BY MOUTH ONCE DAILY, Disp: 90 tablet, Rfl: 3  Review of Systems  Constitutional: Negative.   Respiratory: Negative.   Gastrointestinal: Negative.   Skin: Positive for rash. Negative for color change, pallor and wound.       Itching especially at night.     Social History   Tobacco Use  . Smoking status: Former Smoker   Packs/day: 0.50    Years: 20.00    Pack years: 10.00    Types: Cigarettes  . Smokeless tobacco: Never Used  . Tobacco comment: QUIT IN 2005  Substance Use Topics  . Alcohol use: No    Alcohol/week: 0.0 standard drinks      Objective:   BP 132/84 (BP Location: Right Arm, Patient Position: Sitting, Cuff Size: Large)   Pulse 76   Temp (!) 96.8 F (36 C) (Temporal)   Wt 245 lb (111.1 kg)   LMP  (LMP Unknown)   BMI 40.77 kg/m  Vitals:   10/18/19 0959  BP: 132/84  Pulse: 76  Temp: (!) 96.8 F (36 C)  TempSrc: Temporal  Weight: 245 lb (111.1 kg)  Body mass index is 40.77 kg/m.   Physical Exam Vitals signs reviewed.  Constitutional:      General: She is not in acute distress.    Appearance: Normal appearance. She is not diaphoretic.  HENT:     Head: Normocephalic and atraumatic.  Eyes:     General: No scleral icterus.    Conjunctiva/sclera: Conjunctivae normal.  Neck:     Musculoskeletal: Neck supple.  Cardiovascular:     Rate and Rhythm: Normal rate and regular rhythm.     Pulses: Normal pulses.  Heart sounds: Normal heart sounds. No murmur.  Pulmonary:     Effort: Pulmonary effort is normal. No respiratory distress.     Breath sounds: Normal breath sounds. No wheezing.  Musculoskeletal:     Right lower leg: No edema.     Left lower leg: No edema.  Lymphadenopathy:     Cervical: No cervical adenopathy.  Skin:    General: Skin is warm and dry.     Capillary Refill: Capillary refill takes less than 2 seconds.     Comments: No rash noted over her arms, legs, torso, scalp.  She does have diffusely dry skin.  She does have folliculitis of her left axilla  Neurological:     Mental Status: She is alert and oriented to person, place, and time. Mental status is at baseline.  Psychiatric:     Comments: Anxious      No results found for any visits on 10/18/19.     Assessment & Plan   1. Dry skin 2. Pruritus -New problem over the last 2 weeks -Reassured  patient that she does not have any rash or any evidence of scabies and that it is likely that if she did have scabies that other bed partners would have a similar rash and pruritus -Advised her to take lukewarm showers, moisturize twice daily with emollient -Discussed return precautions  3. Folliculitis -Noted incidentally on exam -Mild in nature -Treat with topical antibiotic ointment twice daily until resolution -Discussed return precautions   Meds ordered this encounter  Medications  . mupirocin ointment (BACTROBAN) 2 %    Sig: Apply 1 application topically 2 (two) times daily.    Dispense:  22 g    Refill:  0     Return if symptoms worsen or fail to improve.   The entirety of the information documented in the History of Present Illness, Review of Systems and Physical Exam were personally obtained by me. Portions of this information were initially documented by Kavin Leech, CMA and reviewed by me for thoroughness and accuracy.    Bacigalupo, Marzella Schlein, MD MPH [facility]

## 2019-12-03 ENCOUNTER — Encounter: Payer: Self-pay | Admitting: Family Medicine

## 2019-12-03 NOTE — Progress Notes (Deleted)
Patient: Mary Villegas, Female    DOB: 1966-10-10, 53 y.o.   MRN: [id number] Visit Date: 12/03/2019  Today's Provider: Lavon Paganini, MD   No chief complaint on file.  Subjective:     Annual physical exam Mary Villegas is a 53 y.o. female who presents today for health maintenance and complete physical. She feels {DESC; WELL/FAIRLY WELL/POORLY:18703}. She reports exercising ***. She reports she is sleeping {DESC; WELL/FAIRLY WELL/POORLY:18703}. 03/09/2017 Mammogram-BI-RADS 1  -----------------------------------------------------------------   Review of Systems  Constitutional: Negative.   HENT: Negative.   Eyes: Negative.   Respiratory: Negative.   Cardiovascular: Negative.   Gastrointestinal: Negative.   Endocrine: Negative.   Genitourinary: Negative.   Musculoskeletal: Negative.   Skin: Negative.   Allergic/Immunologic: Negative.   Neurological: Negative.   Hematological: Negative.   Psychiatric/Behavioral: Negative.     Social History      She  reports that she has quit smoking. Her smoking use included cigarettes. She has a 10.00 pack-year smoking history. She has never used smokeless tobacco. She reports that she does not drink alcohol or use drugs.       Social History   Socioeconomic History  . Marital status: Married    Spouse name: Not on file  . Number of children: Not on file  . Years of education: Not on file  . Highest education level: Not on file  Occupational History  . Not on file  Tobacco Use  . Smoking status: Former Smoker    Packs/day: 0.50    Years: 20.00    Pack years: 10.00    Types: Cigarettes  . Smokeless tobacco: Never Used  . Tobacco comment: QUIT IN 2005  Substance and Sexual Activity  . Alcohol use: No    Alcohol/week: 0.0 standard drinks  . Drug use: No  . Sexual activity: Not on file  Other Topics Concern  . Not on file  Social History Narrative  . Not on file   Social Determinants of Health    Financial Resource Strain:   . Difficulty of Paying Living Expenses: Not on file  Food Insecurity:   . Worried About Charity fundraiser in the Last Year: Not on file  . Ran Out of Food in the Last Year: Not on file  Transportation Needs:   . Lack of Transportation (Medical): Not on file  . Lack of Transportation (Non-Medical): Not on file  Physical Activity:   . Days of Exercise per Week: Not on file  . Minutes of Exercise per Session: Not on file  Stress:   . Feeling of Stress : Not on file  Social Connections:   . Frequency of Communication with Friends and Family: Not on file  . Frequency of Social Gatherings with Friends and Family: Not on file  . Attends Religious Services: Not on file  . Active Member of Clubs or Organizations: Not on file  . Attends Archivist Meetings: Not on file  . Marital Status: Not on file    Past Medical History:  Diagnosis Date  . Anxiety   . Depression   . Hypertension      Patient Active Problem List   Diagnosis Date Noted  . Moderate episode of recurrent major depressive disorder ([place]) 08/31/2019  . GAD (generalized anxiety disorder) 08/31/2019  . Morbid obesity ([place]) 08/31/2019  . Absence of menstruation 06/18/2015  . Pain in shoulder 06/18/2015  . Dermatitis, eczematoid 06/18/2015  . Essential (primary) hypertension 06/18/2015  .  LBP (low back pain) 06/18/2015  . Adult BMI 30+ 06/18/2015  . Cervical pain 06/18/2015  . Avitaminosis D 06/18/2015  . Hypercholesterolemia without hypertriglyceridemia 10/13/2007  . Acute anxiety 07/07/2007    Past Surgical History:  Procedure Laterality Date  . APPENDECTOMY    . TUBAL LIGATION  1992    Family History        Family Status  Relation Name Status  . Mother  Deceased at age 55  . Father  Deceased at age 52  . Sister  Alive  . Brother  Alive  . Daughter 1 Deceased at age 39 MONTHS  . Son 1 Alive  . MGM  Deceased  . Daughter 2 Alive  . Daughter 3 Alive        Her  family history includes Alcohol abuse in her father; Cirrhosis in her father and maternal grandmother; Diabetes in her brother, daughter, father, and sister; Melanoma in her mother.      Allergies  Allergen Reactions  . Atorvastatin     Other reaction(s): Muscle Pain  . Lovastatin     myalgias  . Pravastatin Sodium     myalgias  . Simvastatin     Causes anxiety     Current Outpatient Medications:  .  cholecalciferol (VITAMIN D) 1000 UNITS tablet, Take 1 tablet by mouth daily., Disp: , Rfl:  .  citalopram (CELEXA) 40 MG tablet, Take 1 tablet (40 mg total) by mouth daily., Disp: 90 tablet, Rfl: 1 .  hydrochlorothiazide (MICROZIDE) 12.5 MG capsule, Take 1 capsule (12.5 mg total) by mouth daily., Disp: 90 capsule, Rfl: 3 .  hydrOXYzine (VISTARIL) 50 MG capsule, One daily as needed for anxiety, Disp: 30 capsule, Rfl: 1 .  MAGNESIUM PO, Take 1 tablet by mouth daily., Disp: , Rfl:  .  metoprolol succinate (TOPROL-XL) 50 MG 24 hr tablet, TAKE 1 TABLET BY MOUTH ONCE DAILY, Disp: 90 tablet, Rfl: 3 .  mupirocin ointment (BACTROBAN) 2 %, Apply 1 application topically 2 (two) times daily., Disp: 22 g, Rfl: 0   Patient Care Team: Bacigalupo, Marzella Schlein, MD as PCP - General (Family Medicine)    Objective:    Vitals: LMP  (LMP Unknown)   There were no vitals filed for this visit.   Physical Exam   Depression Screen PHQ 2/9 Scores 08/31/2019  PHQ - 2 Score 2  PHQ- 9 Score 13       Assessment & Plan:     Routine Health Maintenance and Physical Exam  Exercise Activities and Dietary recommendations Goals   None     Immunization History  Administered Date(s) Administered  . Tdap 09/23/2010    Health Maintenance  Topic Date Due  . PAP SMEAR-Modifier  06/19/1985  . COLONOSCOPY  06/19/2014  . MAMMOGRAM  03/05/2019  . INFLUENZA VACCINE  03/12/2020 (Originally 07/14/2019)  . TETANUS/TDAP  09/23/2020  . Hepatitis C Screening  Completed  . HIV Screening  Completed      Discussed health benefits of physical activity, and encouraged her to engage in regular exercise appropriate for her age and condition.    --------------------------------------------------------------------    Shirlee Latch, MD  [facility]

## 2019-12-04 DIAGNOSIS — R110 Nausea: Secondary | ICD-10-CM | POA: Diagnosis not present

## 2019-12-04 DIAGNOSIS — U071 COVID-19: Secondary | ICD-10-CM | POA: Diagnosis not present

## 2019-12-06 ENCOUNTER — Emergency Department: Payer: BC Managed Care – PPO

## 2019-12-06 ENCOUNTER — Encounter: Payer: Self-pay | Admitting: Emergency Medicine

## 2019-12-06 ENCOUNTER — Inpatient Hospital Stay
Admission: EM | Admit: 2019-12-06 | Discharge: 2019-12-10 | DRG: 177 | Disposition: A | Discharge status: Home / Self Care | Payer: BC Managed Care – PPO | Attending: Hospitalist | Admitting: Hospitalist

## 2019-12-06 ENCOUNTER — Other Ambulatory Visit: Payer: Self-pay

## 2019-12-06 DIAGNOSIS — F419 Anxiety disorder, unspecified: Secondary | ICD-10-CM | POA: Diagnosis not present

## 2019-12-06 DIAGNOSIS — F329 Major depressive disorder, single episode, unspecified: Secondary | ICD-10-CM | POA: Diagnosis present

## 2019-12-06 DIAGNOSIS — R509 Fever, unspecified: Secondary | ICD-10-CM | POA: Diagnosis not present

## 2019-12-06 DIAGNOSIS — J1289 Other viral pneumonia: Secondary | ICD-10-CM | POA: Diagnosis not present

## 2019-12-06 DIAGNOSIS — Z811 Family history of alcohol abuse and dependence: Secondary | ICD-10-CM

## 2019-12-06 DIAGNOSIS — J9601 Acute respiratory failure with hypoxia: Secondary | ICD-10-CM

## 2019-12-06 DIAGNOSIS — Z833 Family history of diabetes mellitus: Secondary | ICD-10-CM

## 2019-12-06 DIAGNOSIS — N3001 Acute cystitis with hematuria: Secondary | ICD-10-CM | POA: Diagnosis not present

## 2019-12-06 DIAGNOSIS — J1282 Pneumonia due to coronavirus disease 2019: Secondary | ICD-10-CM

## 2019-12-06 DIAGNOSIS — Z87891 Personal history of nicotine dependence: Secondary | ICD-10-CM | POA: Diagnosis not present

## 2019-12-06 DIAGNOSIS — I10 Essential (primary) hypertension: Secondary | ICD-10-CM

## 2019-12-06 DIAGNOSIS — Z79899 Other long term (current) drug therapy: Secondary | ICD-10-CM | POA: Diagnosis not present

## 2019-12-06 DIAGNOSIS — K529 Noninfective gastroenteritis and colitis, unspecified: Secondary | ICD-10-CM | POA: Diagnosis present

## 2019-12-06 DIAGNOSIS — N179 Acute kidney failure, unspecified: Secondary | ICD-10-CM | POA: Diagnosis not present

## 2019-12-06 DIAGNOSIS — B9620 Unspecified Escherichia coli [E. coli] as the cause of diseases classified elsewhere: Secondary | ICD-10-CM | POA: Diagnosis present

## 2019-12-06 DIAGNOSIS — R0602 Shortness of breath: Secondary | ICD-10-CM | POA: Diagnosis not present

## 2019-12-06 DIAGNOSIS — Z808 Family history of malignant neoplasm of other organs or systems: Secondary | ICD-10-CM | POA: Diagnosis not present

## 2019-12-06 DIAGNOSIS — R109 Unspecified abdominal pain: Secondary | ICD-10-CM | POA: Diagnosis not present

## 2019-12-06 DIAGNOSIS — U071 COVID-19: Principal | ICD-10-CM

## 2019-12-06 DIAGNOSIS — E860 Dehydration: Secondary | ICD-10-CM | POA: Diagnosis not present

## 2019-12-06 DIAGNOSIS — N390 Urinary tract infection, site not specified: Secondary | ICD-10-CM

## 2019-12-06 LAB — CBC WITH DIFFERENTIAL/PLATELET
Abs Immature Granulocytes: 0.02 10*3/uL (ref 0.00–0.07)
Basophils Absolute: 0 10*3/uL (ref 0.0–0.1)
Basophils Relative: 0 %
Eosinophils Absolute: 0 10*3/uL (ref 0.0–0.5)
Eosinophils Relative: 0 %
HCT: 36.9 % (ref 36.0–46.0)
Hemoglobin: 12.9 g/dL (ref 12.0–15.0)
Immature Granulocytes: 0 %
Lymphocytes Relative: 20 %
Lymphs Abs: 1 10*3/uL (ref 0.7–4.0)
MCH: 27.6 pg (ref 26.0–34.0)
MCHC: 35 g/dL (ref 30.0–36.0)
MCV: 79 fL — ABNORMAL LOW (ref 80.0–100.0)
Monocytes Absolute: 0.2 10*3/uL (ref 0.1–1.0)
Monocytes Relative: 4 %
Neutro Abs: 3.7 10*3/uL (ref 1.7–7.7)
Neutrophils Relative %: 76 %
Platelets: 195 10*3/uL (ref 150–400)
RBC: 4.67 MIL/uL (ref 3.87–5.11)
RDW: 13 % (ref 11.5–15.5)
Smear Review: NORMAL
WBC: 4.9 10*3/uL (ref 4.0–10.5)
nRBC: 0 % (ref 0.0–0.2)

## 2019-12-06 LAB — MAGNESIUM: Magnesium: 1.8 mg/dL (ref 1.7–2.4)

## 2019-12-06 LAB — URINALYSIS, ROUTINE W REFLEX MICROSCOPIC
Bilirubin Urine: NEGATIVE
Glucose, UA: NEGATIVE mg/dL
Ketones, ur: NEGATIVE mg/dL
Leukocytes,Ua: NEGATIVE
Nitrite: NEGATIVE
Protein, ur: 100 mg/dL — AB
RBC / HPF: >50 RBC/hpf — ABNORMAL HIGH (ref 0–5)
Specific Gravity, Urine: 1.013 (ref 1.005–1.030)
pH: 7 (ref 5.0–8.0)

## 2019-12-06 LAB — COMPREHENSIVE METABOLIC PANEL
ALT: 26 U/L (ref 0–44)
AST: 28 U/L (ref 15–41)
Albumin: 3.3 g/dL — ABNORMAL LOW (ref 3.5–5.0)
Alkaline Phosphatase: 59 U/L (ref 38–126)
Anion gap: 10 (ref 5–15)
BUN: 12 mg/dL (ref 6–20)
CO2: 24 mmol/L (ref 22–32)
Calcium: 7.8 mg/dL — ABNORMAL LOW (ref 8.9–10.3)
Chloride: 102 mmol/L (ref 98–111)
Creatinine, Ser: 1.23 mg/dL — ABNORMAL HIGH (ref 0.44–1.00)
GFR calc Af Amer: 57 mL/min — ABNORMAL LOW (ref >60)
GFR calc non Af Amer: 49 mL/min — ABNORMAL LOW (ref >60)
Glucose, Bld: 128 mg/dL — ABNORMAL HIGH (ref 70–99)
Potassium: 3.8 mmol/L (ref 3.5–5.1)
Sodium: 136 mmol/L (ref 135–145)
Total Bilirubin: 0.6 mg/dL (ref 0.3–1.2)
Total Protein: 6.7 g/dL (ref 6.5–8.1)

## 2019-12-06 LAB — LACTATE DEHYDROGENASE: LDH: 275 U/L — ABNORMAL HIGH (ref 98–192)

## 2019-12-06 LAB — RESPIRATORY PANEL BY RT PCR (FLU A&B, COVID)
Influenza A by PCR: NEGATIVE
Influenza B by PCR: NEGATIVE
SARS Coronavirus 2 by RT PCR: POSITIVE — AB

## 2019-12-06 LAB — LACTIC ACID, PLASMA: Lactic Acid, Venous: 1 mmol/L (ref 0.5–1.9)

## 2019-12-06 LAB — FIBRIN DERIVATIVES D-DIMER (ARMC ONLY): Fibrin derivatives D-dimer (ARMC): 2442.22 ng/mL (FEU) — ABNORMAL HIGH (ref 0.00–499.00)

## 2019-12-06 LAB — ABO/RH: ABO/RH(D): A POS

## 2019-12-06 LAB — FERRITIN: Ferritin: 98 ng/mL (ref 11–307)

## 2019-12-06 LAB — TROPONIN I (HIGH SENSITIVITY): Troponin I (High Sensitivity): 13 ng/L (ref <18)

## 2019-12-06 LAB — PROCALCITONIN: Procalcitonin: <0.1 ng/mL

## 2019-12-06 LAB — FIBRINOGEN: Fibrinogen: 458 mg/dL (ref 210–475)

## 2019-12-06 MED ORDER — ONDANSETRON HCL 4 MG/2ML IJ SOLN: 4.0000 mg | Freq: Four times a day (QID) | INTRAMUSCULAR | Status: DC | PRN

## 2019-12-06 MED ORDER — DEXAMETHASONE SODIUM PHOSPHATE 10 MG/ML IJ SOLN
6.0000 mg | Freq: Once | INTRAMUSCULAR | Status: AC
  Administered 2019-12-06: 6 mg via INTRAVENOUS
  Filled 2019-12-06: qty 1

## 2019-12-06 MED ORDER — GUAIFENESIN-DM 100-10 MG/5ML PO SYRP
10.0000 mL | ORAL_SOLUTION | ORAL | Status: DC | PRN
  Filled 2019-12-06: qty 10

## 2019-12-06 MED ORDER — ENOXAPARIN SODIUM 40 MG/0.4ML ~~LOC~~ SOLN
40.0000 mg | SUBCUTANEOUS | Status: DC
  Administered 2019-12-07: 40 mg via SUBCUTANEOUS
  Filled 2019-12-06: qty 0.4

## 2019-12-06 MED ORDER — ASCORBIC ACID 500 MG PO TABS
500.0000 mg | ORAL_TABLET | Freq: Every day | ORAL | Status: DC
  Administered 2019-12-07 – 2019-12-10 (×4): 500 mg via ORAL
  Filled 2019-12-06 (×4): qty 1

## 2019-12-06 MED ORDER — METOPROLOL SUCCINATE ER 50 MG PO TB24
50.0000 mg | ORAL_TABLET | Freq: Every day | ORAL | Status: DC
  Administered 2019-12-07 – 2019-12-10 (×4): 50 mg via ORAL
  Filled 2019-12-06 (×4): qty 1

## 2019-12-06 MED ORDER — ADULT MULTIVITAMIN W/MINERALS CH
1.0000 | ORAL_TABLET | Freq: Every day | ORAL | Status: DC
  Administered 2019-12-07 – 2019-12-10 (×4): 1 via ORAL
  Filled 2019-12-06 (×4): qty 1

## 2019-12-06 MED ORDER — SODIUM CHLORIDE 0.9 % IV SOLN
1.0000 g | Freq: Once | INTRAVENOUS | Status: AC
  Administered 2019-12-06: 1 g via INTRAVENOUS
  Filled 2019-12-06: qty 10

## 2019-12-06 MED ORDER — ONDANSETRON HCL 4 MG/2ML IJ SOLN
4.0000 mg | Freq: Once | INTRAMUSCULAR | Status: AC
  Administered 2019-12-06: 4 mg via INTRAVENOUS
  Filled 2019-12-06: qty 2

## 2019-12-06 MED ORDER — SENNOSIDES-DOCUSATE SODIUM 8.6-50 MG PO TABS: 1.0000 | ORAL_TABLET | Freq: Every evening | ORAL | Status: DC | PRN

## 2019-12-06 MED ORDER — ZINC SULFATE 220 (50 ZN) MG PO CAPS
220.0000 mg | ORAL_CAPSULE | Freq: Every day | ORAL | Status: DC
  Administered 2019-12-07 – 2019-12-10 (×4): 220 mg via ORAL
  Filled 2019-12-06 (×4): qty 1

## 2019-12-06 MED ORDER — ALBUTEROL SULFATE HFA 108 (90 BASE) MCG/ACT IN AERS
2.0000 | INHALATION_SPRAY | Freq: Four times a day (QID) | RESPIRATORY_TRACT | Status: DC
  Administered 2019-12-07 – 2019-12-10 (×14): 2 via RESPIRATORY_TRACT
  Filled 2019-12-06: qty 6.7

## 2019-12-06 MED ORDER — ACETAMINOPHEN 500 MG PO TABS
1000.0000 mg | ORAL_TABLET | Freq: Once | ORAL | Status: AC
  Administered 2019-12-06: 1000 mg via ORAL
  Filled 2019-12-06: qty 2

## 2019-12-06 MED ORDER — DEXAMETHASONE SODIUM PHOSPHATE 10 MG/ML IJ SOLN
6.0000 mg | Freq: Every day | INTRAMUSCULAR | Status: DC
  Administered 2019-12-07 – 2019-12-09 (×3): 6 mg via INTRAVENOUS
  Filled 2019-12-06 (×4): qty 0.6

## 2019-12-06 MED ORDER — ONDANSETRON HCL 4 MG PO TABS: 4.0000 mg | ORAL_TABLET | Freq: Four times a day (QID) | ORAL | Status: DC | PRN

## 2019-12-06 MED ORDER — SODIUM CHLORIDE 0.9 % IV BOLUS
500.0000 mL | Freq: Once | INTRAVENOUS | Status: AC
  Administered 2019-12-06: 500 mL via INTRAVENOUS

## 2019-12-06 MED ORDER — SODIUM CHLORIDE 0.9 % IV SOLN: 1.0000 g | INTRAVENOUS | Status: DC

## 2019-12-06 MED ORDER — ACETAMINOPHEN 325 MG PO TABS
650.0000 mg | ORAL_TABLET | Freq: Four times a day (QID) | ORAL | Status: DC | PRN
  Administered 2019-12-07 (×2): 650 mg via ORAL
  Filled 2019-12-06 (×2): qty 2

## 2019-12-06 NOTE — H&P (Signed)
History and Physical    Mary Villegas SAY:[id number] DOB: November 11, 1966 DOA: 12/06/2019  PCP: Virginia Crews, MD  Patient coming from: home  I have personally briefly reviewed patient's old medical records in [place]  Chief Complaint: Nausea vomiting fever and weakness  HPI: Mary Villegas is a 53 y.o. female with history of essential hypertension who tested positive for COVID-19  On 12/03/19 who reports symptoms since 11/26/19. She has had near daily nausea vomiting up to three episodes daily with difficulty holding down even fluids and associated loose BM up to 3 daily. She denies a cough but reports progressive weakness. In the past three days she developed shortness of breath. Also has pain in the right lower quadrant that feels like 'pain in her ovaries'.  On arrival in the emergency room she was febrile with a temperature of 103.2, blood pressure 119/72, heart rate 109.  sHe was tachypneic with respirations of 22 with O2 sat in the mid 80s on room air.  She required oxygen via nasal cannula at 2 L to maintain sats in the mid to high 90s.  She had elevated inflammatory markers with LDH of 275 and increased fibrinogen.  Blood work was significant for creatinine of 1.23. Chest x-ray showed bilateral opacities and CT abdomen and pelvis that was done to evaluate for possible kidney stones showed no acute intra-abdominal pathology but did show groundglass opacities consistent with COVID-19 pneumonia.  Patient was started on IV Rocephin for possible UTI and given Decadron and hospitalist consulted for admission.   Review of Systems: As per HPI otherwise 10 point review of systems negative.   Past Medical History:  Diagnosis Date  . Anxiety   . Depression   . Hypertension     Past Surgical History:  Procedure Laterality Date  . APPENDECTOMY    . TUBAL LIGATION  1992     reports that she has quit smoking. Her smoking use included cigarettes. She has a 10.00  pack-year smoking history. She has never used smokeless tobacco. She reports that she does not drink alcohol or use drugs.  Allergies  Allergen Reactions  . Atorvastatin     Other reaction(s): Muscle Pain  . Lovastatin     myalgias  . Pravastatin Sodium     myalgias  . Simvastatin     Causes anxiety    Family History  Problem Relation Age of Onset  . Melanoma Mother   . Diabetes Father   . Cirrhosis Father   . Alcohol abuse Father   . Diabetes Sister   . Diabetes Brother   . Cirrhosis Maternal Grandmother   . Diabetes Daughter      Prior to Admission medications   Medication Sig Start Date End Date Taking? Authorizing Provider  citalopram (CELEXA) 40 MG tablet Take 1 tablet (40 mg total) by mouth daily. 08/31/19  Yes Bacigalupo, Dionne Bucy, MD  hydrOXYzine (VISTARIL) 50 MG capsule One daily as needed for anxiety 07/31/18  Yes Carmon Ginsberg, PA  metoprolol succinate (TOPROL-XL) 50 MG 24 hr tablet TAKE 1 TABLET BY MOUTH ONCE DAILY 08/31/19  Yes Bacigalupo, Dionne Bucy, MD  ondansetron (ZOFRAN-ODT) 4 MG disintegrating tablet Take 4 mg by mouth every 4 (four) hours as needed. 12/04/19  Yes [provider]  cholecalciferol (VITAMIN D) 1000 UNITS tablet Take 1 tablet by mouth daily.    [provider]  hydrochlorothiazide (MICROZIDE) 12.5 MG capsule Take 1 capsule (12.5 mg total) by mouth daily. Patient not taking: Reported  on 12/06/2019 08/31/19   Erasmo DownerBacigalupo, Angela M, MD  MAGNESIUM PO Take 1 tablet by mouth daily.    [provider]  mupirocin ointment (BACTROBAN) 2 % Apply 1 application topically 2 (two) times daily. Patient not taking: Reported on 12/06/2019 10/18/19   Erasmo DownerBacigalupo, Angela M, MD    Physical Exam: Vitals:   12/06/19 2115 12/06/19 2130 12/06/19 2143 12/06/19 2212  BP:   125/63   Pulse: 95 94 99   Resp: (!) 28 18 (!) 25   Temp:    100.1 F (37.8 C)  TempSrc:    Oral  SpO2: 96% 95% 95%   Weight:      Height:         Vitals:    12/06/19 2115 12/06/19 2130 12/06/19 2143 12/06/19 2212  BP:   125/63   Pulse: 95 94 99   Resp: (!) 28 18 (!) 25   Temp:    100.1 F (37.8 C)  TempSrc:    Oral  SpO2: 96% 95% 95%   Weight:      Height:        Constitutional: NAD,  But does have conversational dyspnea, mild tachypnea. Eyes: PERRL, lids and conjunctivae normal ENMT: Mucous membranes are moist.  Neck: normal, supple, no masses, no thyromegaly Respiratory: few wheezing, with few crackles. tachypneic   Cardiovascular: Regular rate and rhythm, no murmurs / rubs / gallops. No extremity edema. 2+ pedal pulses. No carotid bruits.  Abdomen: no tenderness, no masses palpated. No hepatosplenomegaly. Bowel sounds positive.  Musculoskeletal: no clubbing / cyanosis. No joint deformity upper and lower extremities. Good ROM, no contractures. Normal muscle tone.  Skin: no rashes, lesions, ulcers. No induration Neurologic: CN 2-12 grossly intact. Sensation intact, DTR normal. Strength 5/5 in all 4.  Psychiatric: Normal judgment and insight. Alert and oriented x 3.Appears a abit anxious   Labs on Admission: I have personally reviewed following labs and imaging studies  CBC: Recent Labs  Lab 12/06/19 2037  WBC 4.9  NEUTROABS 3.7  HGB 12.9  HCT 36.9  MCV 79.0*  PLT 195   Basic Metabolic Panel: Recent Labs  Lab 12/06/19 2037  NA 136  K 3.8  CL 102  CO2 24  GLUCOSE 128*  BUN 12  CREATININE 1.23*  CALCIUM 7.8*  MG 1.8   GFR: Estimated Creatinine Clearance: 63.5 mL/min (A) (by C-G formula based on SCr of 1.23 mg/dL (H)). Liver Function Tests: Recent Labs  Lab 12/06/19 2037  AST 28  ALT 26  ALKPHOS 59  BILITOT 0.6  PROT 6.7  ALBUMIN 3.3*   No results for input(s): LIPASE, AMYLASE in the last 168 hours. No results for input(s): AMMONIA in the last 168 hours. Coagulation Profile: No results for input(s): INR, PROTIME in the last 168 hours. Cardiac Enzymes: No results for input(s): CKTOTAL, CKMB, CKMBINDEX,  TROPONINI in the last 168 hours. BNP (last 3 results) No results for input(s): PROBNP in the last 8760 hours. HbA1C: No results for input(s): HGBA1C in the last 72 hours. CBG: No results for input(s): GLUCAP in the last 168 hours. Lipid Profile: No results for input(s): CHOL, HDL, LDLCALC, TRIG, CHOLHDL, LDLDIRECT in the last 72 hours. Thyroid Function Tests: No results for input(s): TSH, T4TOTAL, FREET4, T3FREE, THYROIDAB in the last 72 hours. Anemia Panel: Recent Labs    12/06/19 2037  FERRITIN 98   Urine analysis:    Component Value Date/Time   COLORURINE YELLOW (A) 12/06/2019 2107   APPEARANCEUR HAZY (A) 12/06/2019 2107  LABSPEC 1.013 12/06/2019 2107   PHURINE 7.0 12/06/2019 2107   GLUCOSEU NEGATIVE 12/06/2019 2107   HGBUR LARGE (A) 12/06/2019 2107   BILIRUBINUR NEGATIVE 12/06/2019 2107   KETONESUR NEGATIVE 12/06/2019 2107   PROTEINUR 100 (A) 12/06/2019 2107   NITRITE NEGATIVE 12/06/2019 2107   LEUKOCYTESUR NEGATIVE 12/06/2019 2107    Radiological Exams on Admission: DG Chest Port 1 View  Result Date: 12/06/2019 CLINICAL DATA:  Shortness of breath, fever EXAM: PORTABLE CHEST 1 VIEW COMPARISON:  None. FINDINGS: The heart size and mediastinal contours are within normal limits. Patchy/streaky airspace disease seen within the right lower lung and left lower lung. There is mildly increased interstitial markings seen throughout both lungs. No acute osseous abnormality. IMPRESSION: Airspace opacities seen within both lungs which could be due to viral infectious etiology and/or asymmetric edema is Electronically Signed   By: Jonna Clark M.D.   On: 12/06/2019 21:01   CT Renal Stone Study  Result Date: 12/06/2019 CLINICAL DATA:  Flank pain, COVID positive EXAM: CT ABDOMEN AND PELVIS WITHOUT CONTRAST TECHNIQUE: Multidetector CT imaging of the abdomen and pelvis was performed following the standard protocol without IV contrast. COMPARISON:  None FINDINGS: Lower chest: The  visualized heart size within normal limits. No pericardial fluid/thickening. No hiatal hernia. Multifocal patchy/ground-glass opacity seen at both lung bases. Hepatobiliary: Although limited due to the lack of intravenous contrast, normal in appearance without gross focal abnormality. No evidence of calcified gallstones or biliary ductal dilatation. Pancreas:  Unremarkable.  No surrounding inflammatory changes. Spleen: Normal in size. Although limited due to the lack of intravenous contrast, normal in appearance. Adrenals/Urinary Tract: Both adrenal glands appear normal. The kidneys and collecting system appear normal without evidence of urinary tract calculus or hydronephrosis. Bladder is unremarkable. Stomach/Bowel: The stomach, small bowel, and colon are normal in appearance. No inflammatory changes or obstructive findings. Vascular/Lymphatic: There are no enlarged abdominal or pelvic lymph nodes. Scattered aortic atherosclerotic calcifications are seen without aneurysmal dilatation. Reproductive: The uterus and adnexa are unremarkable. Other: No evidence of abdominal wall mass or hernia. Musculoskeletal: No acute or significant osseous findings. IMPRESSION: Multifocal patchy/ground-glass opacities throughout both lungs, consistent with COVID pneumonia. No acute intra-abdominal or pelvic pathology. Aortic Atherosclerosis (ICD10-I70.0). Electronically Signed   By: Jonna Clark M.D.   On: 12/06/2019 22:29    EKG: Independently reviewed.   Assessment/Plan    Acute respiratory failure with hypoxia (HCC) secondary to Covid 19 pneumonia  --Patient  with O2 sat mid eighties on RA --CT abdomen and pelvis showed multifocal patchy/groundglass opacities throughout both lungs consistent with Covid pneumonia\ --Supplemental oxygen to keep sats over 90% --Proning as tolerated --Remdesivir , steroids, multivitamins as ordered --Follow inflammatory markers    AKI (acute kidney injury) (HCC) --Creatinine 1.23,   secondary to dehydration from nausea and vomiting --Patient got a small 500 mL bolus of normal saline in the emergency room.  --Given COVID-19 pneumonia will only hydrate as needed if no improvement --Continue to monitor renal function --CT abdomen and pelvis did not show any acute intra-abdominal or pelvic pathology  Acute gastroenteritis --related most likely to COVID-19 infection --Antiemetics --IV hydration as needed --CT abdomen and pelvis showed no acute pathology     Essential hypertension --Continue home metoprolol     DVT prophylaxis: lovenox  Code Status: gi;;   Disposition Plan: Back to previous home environment Consults called: none Admission status: IN    Andris Baumann MD [facility]   If 7PM-7AM, please contact night-coverage [email or web address] Password [email or web address]  12/06/2019, 10:43 PM

## 2019-12-06 NOTE — ED Notes (Signed)
PT given ice chips with permission of MD

## 2019-12-06 NOTE — ED Provider Notes (Signed)
[facility] Emergency Department Provider Note  ____________________________________________   First MD Initiated Contact with Patient 12/06/19 2033     (approximate)  I have reviewed the triage vital signs and the nursing notes.   HISTORY  Chief Complaint Fever and Emesis    HPI Mary Villegas is a 53 y.o. female with hypertension, depression, anxiety who comes in with fever and emesis.  Patient was positive for coronavirus however she states she feels that she has a UTI.  Patient symptoms for started 12/14.  Patient states her symptoms are not getting any better.  Patient has been having nausea and vomiting which are her worst symptoms.  They are intermittent, severe, worse after eating, nothing makes it better including Zofran.  She denies any focal abdominal pain discount of some overall cramping.  She is also been having fevers.  Some mild shortness of breath as well.          Past Medical History:  Diagnosis Date  . Anxiety   . Depression   . Hypertension     Patient Active Problem List   Diagnosis Date Noted  . Moderate episode of recurrent major depressive disorder (HCC) 08/31/2019  . GAD (generalized anxiety disorder) 08/31/2019  . Morbid obesity (HCC) 08/31/2019  . Absence of menstruation 06/18/2015  . Pain in shoulder 06/18/2015  . Dermatitis, eczematoid 06/18/2015  . Essential (primary) hypertension 06/18/2015  . LBP (low back pain) 06/18/2015  . Adult BMI 30+ 06/18/2015  . Cervical pain 06/18/2015  . Avitaminosis D 06/18/2015  . Hypercholesterolemia without hypertriglyceridemia 10/13/2007  . Acute anxiety 07/07/2007    Past Surgical History:  Procedure Laterality Date  . APPENDECTOMY    . TUBAL LIGATION  1992    Prior to Admission medications   Medication Sig Start Date End Date Taking? Authorizing Provider  cholecalciferol (VITAMIN D) 1000 UNITS tablet Take 1 tablet by mouth daily.    [provider]    citalopram (CELEXA) 40 MG tablet Take 1 tablet (40 mg total) by mouth daily. 08/31/19   Erasmo Downer, MD  hydrochlorothiazide (MICROZIDE) 12.5 MG capsule Take 1 capsule (12.5 mg total) by mouth daily. 08/31/19   Erasmo Downer, MD  hydrOXYzine (VISTARIL) 50 MG capsule One daily as needed for anxiety 07/31/18   Anola Gurney, [place]  MAGNESIUM PO Take 1 tablet by mouth daily.    [provider]  metoprolol succinate (TOPROL-XL) 50 MG 24 hr tablet TAKE 1 TABLET BY MOUTH ONCE DAILY 08/31/19   Bacigalupo, Marzella Schlein, MD  mupirocin ointment (BACTROBAN) 2 % Apply 1 application topically 2 (two) times daily. 10/18/19   Erasmo Downer, MD    Allergies Atorvastatin, Lovastatin, Pravastatin sodium, and Simvastatin  Family History  Problem Relation Age of Onset  . Melanoma Mother   . Diabetes Father   . Cirrhosis Father   . Alcohol abuse Father   . Diabetes Sister   . Diabetes Brother   . Cirrhosis Maternal Grandmother   . Diabetes Daughter     Social History Social History   Tobacco Use  . Smoking status: Former Smoker    Packs/day: 0.50    Years: 20.00    Pack years: 10.00    Types: Cigarettes  . Smokeless tobacco: Never Used  . Tobacco comment: QUIT IN 2005  Substance Use Topics  . Alcohol use: No    Alcohol/week: 0.0 standard drinks  . Drug use: No      Review of Systems Constitutional: Positive  fevers Eyes: No visual changes. ENT: No sore throat. Cardiovascular: Denies chest pain. Respiratory: \Positive shortness of breath Gastrointestinal: No abdominal pain.  Positive nausea and vomiting no diarrhea.  No constipation. Genitourinary: + for dysuria. Musculoskeletal: Negative for back pain. Skin: Negative for rash. Neurological: Negative for headaches, focal weakness or numbness. All other ROS negative ____________________________________________   PHYSICAL EXAM:  VITAL SIGNS: ED Triage Vitals  Enc Vitals Group     BP 12/06/19 2006 119/72      Pulse Rate 12/06/19 2006 (!) 109     Resp 12/06/19 2006 (!) 22     Temp 12/06/19 2006 (!) 103.2 F (39.6 C)     Temp Source 12/06/19 2006 Oral     SpO2 12/06/19 2006 90 %     Weight 12/06/19 2011 240 lb (108.9 kg)     Height 12/06/19 2011 5\' 5"  (1.651 m)     Head Circumference --      Peak Flow --      Pain Score 12/06/19 2010 0     Pain Loc --      Pain Edu? --      Excl. in GC? --     Constitutional: Alert and oriented. Well appearing and in no acute distress. Eyes: Conjunctivae are normal. EOMI. Head: Atraumatic. Nose: No congestion/rhinnorhea. Mouth/Throat: Mucous membranes are moist.   Neck: No stridor. Trachea Midline. FROM Cardiovascular:tachycardia, regular rhythm. Grossly normal heart sounds.  Good peripheral circulation. Respiratory: No increased work of breathing, no stridor, satting 88% on room air Gastrointestinal: Soft and nontender. No distention. No abdominal bruits.  Musculoskeletal: No lower extremity tenderness nor edema.  No joint effusions. Neurologic:  Normal speech and language. No gross focal neurologic deficits are appreciated.  Skin:  Skin is warm, dry and intact. No rash noted. Psychiatric: Mood and affect are normal. Speech and behavior are normal. GU: Deferred   ____________________________________________   LABS (all labs ordered are listed, but only abnormal results are displayed)  Labs Reviewed  CBC WITH DIFFERENTIAL/PLATELET - Abnormal; Notable for the following components:      Result Value   MCV 79.0 (*)    All other components within normal limits  COMPREHENSIVE METABOLIC PANEL - Abnormal; Notable for the following components:   Glucose, Bld 128 (*)    Creatinine, Ser 1.23 (*)    Calcium 7.8 (*)    Albumin 3.3 (*)    GFR calc non Af Amer 49 (*)    GFR calc Af Amer 57 (*)    All other components within normal limits  FIBRIN DERIVATIVES D-DIMER ([facility] ONLY) - Abnormal; Notable for the following components:   Fibrin derivatives  D-dimer [facility]) 2,442.22 (*)    All other components within normal limits  LACTATE DEHYDROGENASE - Abnormal; Notable for the following components:   LDH 275 (*)    All other components within normal limits  URINALYSIS, ROUTINE W REFLEX MICROSCOPIC - Abnormal; Notable for the following components:   Color, Urine YELLOW (*)    APPearance HAZY (*)    Hgb urine dipstick LARGE (*)    Protein, ur 100 (*)    RBC / HPF >50 (*)    Bacteria, UA MANY (*)    All other components within normal limits  CULTURE, BLOOD (ROUTINE X 2)  CULTURE, BLOOD (ROUTINE X 2)  URINE CULTURE  LACTIC ACID, PLASMA  FERRITIN  MAGNESIUM  LACTIC ACID, PLASMA  PROCALCITONIN  TRIGLYCERIDES  FIBRINOGEN  C-REACTIVE PROTEIN  POC URINE PREG, ED  TROPONIN  I (HIGH SENSITIVITY)   ____________________________________________   ED ECG REPORT I, Concha SeMary E Jason Hauge, the attending physician, personally viewed and interpreted this ECG.  EKG is sinus tachycardia rate of 103, no ST elevations, no T wave inversions, normal intervals ____________________________________________  RADIOLOGY Vela ProseI, Loyd Marhefka E Sriyan Cutting, personally viewed and evaluated these images (plain radiographs) as part of my medical decision making, as well as reviewing the written report by the radiologist.  ED MD interpretation: Patchiness bilaterally consistent with her known coronavirus.  No focal infiltrate.    Official radiology report(s): DG Chest Port 1 View  Result Date: 12/06/2019 CLINICAL DATA:  Shortness of breath, fever EXAM: PORTABLE CHEST 1 VIEW COMPARISON:  None. FINDINGS: The heart size and mediastinal contours are within normal limits. Patchy/streaky airspace disease seen within the right lower lung and left lower lung. There is mildly increased interstitial markings seen throughout both lungs. No acute osseous abnormality. IMPRESSION: Airspace opacities seen within both lungs which could be due to viral infectious etiology and/or asymmetric edema is  Electronically Signed   By: Jonna ClarkBindu  Avutu M.D.   On: 12/06/2019 21:01    ____________________________________________   PROCEDURES  Procedure(s) performed (including Critical Care):  .Critical Care Performed by: Concha SeFunke, Darius Fillingim E, MD Authorized by: Concha SeFunke, Panayiotis Rainville E, MD   Critical care provider statement:    Critical care time (minutes):  40   Critical care was necessary to treat or prevent imminent or life-threatening deterioration of the following conditions:  Respiratory failure   Critical care was time spent personally by me on the following activities:  Discussions with consultants, evaluation of patient's response to treatment, examination of patient, ordering and performing treatments and interventions, ordering and review of laboratory studies, ordering and review of radiographic studies, pulse oximetry, re-evaluation of patient's condition, obtaining history from patient or surrogate and review of old charts     ____________________________________________   INITIAL IMPRESSION / ASSESSMENT AND PLAN / ED COURSE  Reggie Pilelizabeth E Fieldhouse was evaluated in Emergency Department on 12/06/2019 for the symptoms described in the history of present illness. She was evaluated in the context of the global COVID-19 pandemic, which necessitated consideration that the patient might be at risk for infection with the SARS-CoV-2 virus that causes COVID-19. Institutional protocols and algorithms that pertain to the evaluation of patients at risk for COVID-19 are in a state of rapid change based on information released by regulatory bodies including the CDC and federal and state organizations. These policies and algorithms were followed during the patient's care in the ED.     Patient presents with coronavirus.  Patient is hypoxic to 88%.  Will place on 2 L give some Decadron.  Will get labs to evaluate for electrolyte abnormalities, AKI.  Patient is abdomen is soft and does not have any evidence of acute  abdominal process such as SBO, diverticulitis.  Patient already had an appendectomy.  Will get urine to evaluate for UTI.  UA possibly UTI but not significantly elevated.  Patient has had some new dysuria and hematuria however.  We will get a CT renal to make sure is no evidence of kidney stone.  Otherwise her creatinine is slightly elevated consistent with her dehydration  Chest x-ray consistent with her known Covid.  CT negative for kidney stone.  Given her urine will send for culture and give a dose of ceftriaxone in case this is a UTI.  I did discuss to the hospital team for admission for her coronavirus.  ____________________________________________   FINAL CLINICAL IMPRESSION(S) /  ED DIAGNOSES   Final diagnoses:  [id number] virus detected  Acute respiratory failure with hypoxia ([place])  Acute cystitis with hematuria      MEDICATIONS GIVEN DURING THIS VISIT:  Medications  dexamethasone (DECADRON) injection 6 mg (has no administration in time range)  cefTRIAXone (ROCEPHIN) 1 g in sodium chloride 0.9 % 100 mL IVPB (has no administration in time range)  metoprolol succinate (TOPROL-XL) 24 hr tablet 50 mg (has no administration in time range)  enoxaparin (LOVENOX) injection 40 mg (has no administration in time range)  albuterol (VENTOLIN HFA) 108 (90 Base) MCG/ACT inhaler 2 puff ( Inhalation Canceled Entry 12/06/19 2258)  dexamethasone (DECADRON) injection 6 mg (has no administration in time range)  guaiFENesin-dextromethorphan (ROBITUSSIN DM) 100-10 MG/5ML syrup 10 mL (has no administration in time range)  ascorbic acid (VITAMIN C) tablet 500 mg (has no administration in time range)  zinc sulfate capsule 220 mg (has no administration in time range)  acetaminophen (TYLENOL) tablet 650 mg (has no administration in time range)  senna-docusate (Senokot-S) tablet 1 tablet (has no administration in time range)  ondansetron (ZOFRAN) tablet 4 mg (has no administration in time range)     Or  ondansetron (ZOFRAN) injection 4 mg (has no administration in time range)  multivitamin with minerals tablet 1 tablet (has no administration in time range)  cefTRIAXone (ROCEPHIN) 1 g in sodium chloride 0.9 % 100 mL IVPB (has no administration in time range)  acetaminophen (TYLENOL) tablet 1,000 mg (1,000 mg Oral Given 12/06/19 2052)  ondansetron (ZOFRAN) injection 4 mg (4 mg Intravenous Given 12/06/19 2052)  sodium chloride 0.9 % bolus 500 mL (500 mLs Intravenous New Bag/Given 12/06/19 2055)     ED Discharge Orders    None       Note:  This document was prepared using Dragon voice recognition software and may include unintentional dictation errors.   Vanessa Granite, MD 12/06/19 [phone]

## 2019-12-06 NOTE — ED Notes (Signed)
Pt reports went to Urgent care and got tested for Covid and came back positive pt reports " I feel like I have a UTI" pt reports was seen 3 days ago and was dehydrated, continues to have N/V

## 2019-12-06 NOTE — ED Triage Notes (Signed)
Patient to ER for c/o fevers, weakness, N/V. States symptoms all began on 12/14. States she has had decreased urine output.

## 2019-12-07 ENCOUNTER — Other Ambulatory Visit: Payer: Self-pay

## 2019-12-07 LAB — COMPREHENSIVE METABOLIC PANEL
ALT: 24 U/L (ref 0–44)
AST: 31 U/L (ref 15–41)
Albumin: 3.3 g/dL — ABNORMAL LOW (ref 3.5–5.0)
Alkaline Phosphatase: 60 U/L (ref 38–126)
Anion gap: 9 (ref 5–15)
BUN: 12 mg/dL (ref 6–20)
CO2: 28 mmol/L (ref 22–32)
Calcium: 8.1 mg/dL — ABNORMAL LOW (ref 8.9–10.3)
Chloride: 103 mmol/L (ref 98–111)
Creatinine, Ser: 1.15 mg/dL — ABNORMAL HIGH (ref 0.44–1.00)
GFR calc Af Amer: >60 mL/min (ref >60)
GFR calc non Af Amer: 54 mL/min — ABNORMAL LOW (ref >60)
Glucose, Bld: 158 mg/dL — ABNORMAL HIGH (ref 70–99)
Potassium: 3.9 mmol/L (ref 3.5–5.1)
Sodium: 140 mmol/L (ref 135–145)
Total Bilirubin: 0.4 mg/dL (ref 0.3–1.2)
Total Protein: 7 g/dL (ref 6.5–8.1)

## 2019-12-07 LAB — C-REACTIVE PROTEIN
CRP: 3.9 mg/dL — ABNORMAL HIGH (ref <1.0)
CRP: 5.8 mg/dL — ABNORMAL HIGH (ref <1.0)

## 2019-12-07 LAB — FERRITIN: Ferritin: 106 ng/mL (ref 11–307)

## 2019-12-07 LAB — CBC WITH DIFFERENTIAL/PLATELET
Abs Immature Granulocytes: 0.02 10*3/uL (ref 0.00–0.07)
Basophils Absolute: 0 10*3/uL (ref 0.0–0.1)
Basophils Relative: 0 %
Eosinophils Absolute: 0 10*3/uL (ref 0.0–0.5)
Eosinophils Relative: 0 %
HCT: 39.4 % (ref 36.0–46.0)
Hemoglobin: 13.4 g/dL (ref 12.0–15.0)
Immature Granulocytes: 0 %
Lymphocytes Relative: 15 %
Lymphs Abs: 0.8 10*3/uL (ref 0.7–4.0)
MCH: 27.1 pg (ref 26.0–34.0)
MCHC: 34 g/dL (ref 30.0–36.0)
MCV: 79.8 fL — ABNORMAL LOW (ref 80.0–100.0)
Monocytes Absolute: 0.1 10*3/uL (ref 0.1–1.0)
Monocytes Relative: 2 %
Neutro Abs: 4.7 10*3/uL (ref 1.7–7.7)
Neutrophils Relative %: 83 %
Platelets: 196 10*3/uL (ref 150–400)
RBC: 4.94 MIL/uL (ref 3.87–5.11)
RDW: 13.2 % (ref 11.5–15.5)
WBC: 5.6 10*3/uL (ref 4.0–10.5)
nRBC: 0 % (ref 0.0–0.2)

## 2019-12-07 LAB — CBC
HCT: 36.1 % (ref 36.0–46.0)
Hemoglobin: 12.4 g/dL (ref 12.0–15.0)
MCH: 27.3 pg (ref 26.0–34.0)
MCHC: 34.3 g/dL (ref 30.0–36.0)
MCV: 79.3 fL — ABNORMAL LOW (ref 80.0–100.0)
Platelets: 186 10*3/uL (ref 150–400)
RBC: 4.55 MIL/uL (ref 3.87–5.11)
RDW: 13.1 % (ref 11.5–15.5)
WBC: 6.4 10*3/uL (ref 4.0–10.5)
nRBC: 0 % (ref 0.0–0.2)

## 2019-12-07 LAB — MAGNESIUM: Magnesium: 1.9 mg/dL (ref 1.7–2.4)

## 2019-12-07 LAB — TROPONIN I (HIGH SENSITIVITY): Troponin I (High Sensitivity): 15 ng/L (ref <18)

## 2019-12-07 LAB — TRIGLYCERIDES: Triglycerides: 106 mg/dL (ref <150)

## 2019-12-07 LAB — PHOSPHORUS: Phosphorus: 3.7 mg/dL (ref 2.5–4.6)

## 2019-12-07 LAB — LACTIC ACID, PLASMA: Lactic Acid, Venous: 0.9 mmol/L (ref 0.5–1.9)

## 2019-12-07 MED ORDER — CHLORHEXIDINE GLUCONATE 0.12 % MT SOLN
15.0000 mL | Freq: Two times a day (BID) | OROMUCOSAL | Status: DC
  Administered 2019-12-07 – 2019-12-10 (×7): 15 mL via OROMUCOSAL
  Filled 2019-12-07 (×7): qty 15

## 2019-12-07 MED ORDER — SODIUM CHLORIDE 0.9 % IV SOLN
100.0000 mg | Freq: Every day | INTRAVENOUS | Status: DC
  Administered 2019-12-08 – 2019-12-10 (×3): 100 mg via INTRAVENOUS
  Filled 2019-12-07 (×3): qty 100

## 2019-12-07 MED ORDER — ENOXAPARIN SODIUM 60 MG/0.6ML ~~LOC~~ SOLN
55.0000 mg | SUBCUTANEOUS | Status: DC
  Administered 2019-12-08 – 2019-12-10 (×3): 55 mg via SUBCUTANEOUS
  Filled 2019-12-07 (×3): qty 0.6

## 2019-12-07 MED ORDER — ORAL CARE MOUTH RINSE
15.0000 mL | Freq: Two times a day (BID) | OROMUCOSAL | Status: DC
  Administered 2019-12-07 – 2019-12-09 (×5): 15 mL via OROMUCOSAL

## 2019-12-07 MED ORDER — SODIUM CHLORIDE 0.9 % IV SOLN
INTRAVENOUS | Status: DC | PRN
  Administered 2019-12-07: 50 mL via INTRAVENOUS

## 2019-12-07 MED ORDER — SODIUM CHLORIDE 0.9 % IV SOLN
200.0000 mg | Freq: Once | INTRAVENOUS | Status: AC
  Administered 2019-12-07: 200 mg via INTRAVENOUS
  Filled 2019-12-07: qty 200

## 2019-12-07 NOTE — ED Notes (Signed)
AAOx3.  Skin warm and dry.  NAD 

## 2019-12-07 NOTE — Consult Note (Signed)
Remdesivir - Pharmacy Brief Note   12/24 SARS Coronavirus 2 by RT PCR- Positive   O:  ALT: 24 CXR: Airspace opacities seen within both lungs which could be due to viral infectious etiology  CT:  Multifocal patchy/ground-glass opacities throughout both lungs, consistent with COVID pneumonia. SpO2: 87% on room air    A/P:  Remdesivir 200 mg IVPB once followed by 100 mg IVPB daily x 4 days.   Pernell Dupre, PharmD, BCPS Clinical Pharmacist 12/07/2019 1:25 PM

## 2019-12-07 NOTE — Progress Notes (Signed)
PROGRESS NOTE    Mary Villegas  NGE:[id number] DOB: 05/09/1966 DOA: 12/06/2019 PCP: Erasmo Downer, MD    Assessment & Plan:   Principal Problem:   Acute respiratory failure with hypoxia [facility]) Active Problems:   Essential hypertension   Pneumonia due to COVID-19 virus   AKI (acute kidney injury) (HCC)   UTI (urinary tract infection)    Mary Villegas is a 53 y.o. Caucasian  female with history of essential hypertension who tested positive for COVID-19 on 12/03/19 with symptoms since 11/26/19, presented complaining of N/V/D, progressive weakness, dyspnea and 'pain in her ovaries'.   Acute respiratory failure with hypoxia (HCC) secondary to Covid 19 pneumonia  --Patient with O2 sat mid 80's on RA, and needed 2L O2. --CT abdomen and pelvis showed multifocal patchy/groundglass opacities throughout both lungs consistent with Covid pneumonia PLAN: --Supplemental oxygen to keep sats over 90% --Proning as tolerated --Remdesivir, steroids, multivitamins as ordered --Follow inflammatory markers  Mild elevation in Cr, not meeting AKI criteria --Creatinine 1.23, (most recent around 1.1), secondary to dehydration from nausea and vomiting --Patient got a small 500 mL bolus of normal saline in the emergency room.  --CT abdomen and pelvis did not show any acute intra-abdominal or pelvic pathology  Acute gastroenteritis --related most likely to COVID-19 infection --Antiemetics --IV hydration as needed --CT abdomen and pelvis showed no acute pathology  Essential hypertension --Continue home metoprolol   DVT prophylaxis: Lovenox SQ Code Status: Full code  Family Communication: Not today Disposition Plan: Home   Subjective and Interval History:  N/V improved, but still felt weak and weak.  Pt's main complaint was her "UTI" and "ovary pain", however denied dysuria.  Pt couldn't tell if she was short of breath or not, but admitted that she usually didn't need to  take multiple breaths in between sentences.  No chest pain.     Objective: Vitals:   12/07/19 0839 12/07/19 1320 12/07/19 1552 12/07/19 1700  BP: 132/67 139/67 138/63   Pulse: 83 89 94   Resp: (!) 22 (!) 22 (!) 22   Temp: 98.6 F (37 C) 99.3 F (37.4 C) (!) 101.7 F (38.7 C) 99.3 F (37.4 C)  TempSrc: Oral Oral Oral Oral  SpO2: 94% 97% 92%   Weight:      Height:        Intake/Output Summary (Last 24 hours) at 12/07/2019 1831 Last data filed at 12/07/2019 1559 Gross per 24 hour  Intake 302.03 ml  Output --  Net 302.03 ml   Filed Weights   12/06/19 2011  Weight: 108.9 kg    Examination:   Constitutional: NAD, AAOx3 HEENT: conjunctivae and lids normal, EOMI CV: RRR no M,R,G. Distal pulses +2.  No cyanosis.  RESP: crackles over posterior RLL, took multiple breaths to finish a sentence, on 2L GI: +BS, ND, pointed to tenderness in bilateral inguinal areas Extremities: No effusions, edema, or tenderness in BLE SKIN: warm, dry and intact Neuro: II - XII grossly intact.  Sensation intact Psych: Depressed mood and affect.     Data Reviewed: I have personally reviewed following labs and imaging studies  CBC: Recent Labs  Lab 12/06/19 2037 12/07/19 0110 12/07/19 0459  WBC 4.9 6.4 5.6  NEUTROABS 3.7  --  4.7  HGB 12.9 12.4 13.4  HCT 36.9 36.1 39.4  MCV 79.0* 79.3* 79.8*  PLT 195 186 196   Basic Metabolic Panel: Recent Labs  Lab 12/06/19 2037 12/07/19 0459  NA 136 140  K 3.8 3.9  CL 102 103  CO2 24 28  GLUCOSE 128* 158*  BUN 12 12  CREATININE 1.23* 1.15*  CALCIUM 7.8* 8.1*  MG 1.8 1.9  PHOS  --  3.7   GFR: Estimated Creatinine Clearance: 67.9 mL/min (A) (by C-G formula based on SCr of 1.15 mg/dL (H)). Liver Function Tests: Recent Labs  Lab 12/06/19 2037 12/07/19 0459  AST 28 31  ALT 26 24  ALKPHOS 59 60  BILITOT 0.6 0.4  PROT 6.7 7.0  ALBUMIN 3.3* 3.3*   No results for input(s): LIPASE, AMYLASE in the last 168 hours. No results for  input(s): AMMONIA in the last 168 hours. Coagulation Profile: No results for input(s): INR, PROTIME in the last 168 hours. Cardiac Enzymes: No results for input(s): CKTOTAL, CKMB, CKMBINDEX, TROPONINI in the last 168 hours. BNP (last 3 results) No results for input(s): PROBNP in the last 8760 hours. HbA1C: No results for input(s): HGBA1C in the last 72 hours. CBG: No results for input(s): GLUCAP in the last 168 hours. Lipid Profile: Recent Labs    12/06/19 2043  TRIG 106   Thyroid Function Tests: No results for input(s): TSH, T4TOTAL, FREET4, T3FREE, THYROIDAB in the last 72 hours. Anemia Panel: Recent Labs    12/06/19 2037 12/07/19 0459  FERRITIN 98 106   Sepsis Labs: Recent Labs  Lab 12/06/19 2037 12/06/19 2043 12/07/19 0110  PROCALCITON <0.10  --   --   LATICACIDVEN  --  1.0 0.9    Recent Results (from the past 240 hour(s))  Blood Culture (routine x 2)     Status: None (Preliminary result)   Collection Time: 12/06/19  8:43 PM   Specimen: BLOOD  Result Value Ref Range Status   Specimen Description BLOOD FATTY CASTS  Final   Special Requests   Final    BOTTLES DRAWN AEROBIC AND ANAEROBIC Blood Culture adequate volume   Culture   Final    NO GROWTH < 12 HOURS Performed at [facility], [address] [place], [place] [address]    Report Status PENDING  Incomplete  Blood Culture (routine x 2)     Status: None (Preliminary result)   Collection Time: 12/06/19  8:48 PM   Specimen: BLOOD LEFT HAND  Result Value Ref Range Status   Specimen Description BLOOD LEFT HAND  Final   Special Requests Blood Culture adequate volume  Final   Culture   Final    NO GROWTH < 12 HOURS Performed at [facility], [address] [place], [place] [address]    Report Status PENDING  Incomplete  Respiratory Panel by RT PCR (Flu A&B, Covid) - Nasopharyngeal Swab     Status: Abnormal   Collection Time: 12/06/19 11:03 PM   Specimen: Nasopharyngeal Swab  Result  Value Ref Range Status   SARS Coronavirus 2 by RT PCR POSITIVE (A) NEGATIVE Final    Comment: RESULT CALLED TO, READ BACK BY AND VERIFIED WITH: PAIGE JOHNSON 12/06/19 @ 2358  MLK (NOTE) SARS-CoV-2 target nucleic acids are DETECTED. SARS-CoV-2 RNA is generally detectable in upper respiratory specimens  during the acute phase of infection. Positive results are indicative of the presence of the identified virus, but do not rule out bacterial infection or co-infection with other pathogens not detected by the test. Clinical correlation with patient history and other diagnostic information is necessary to determine patient infection status. The expected result is Negative. Fact Sheet for Patients:  https://www.moore.com/https://www.fda.gov/media/142436/download Fact Sheet for Healthcare Providers: https://www.young.biz/https://www.fda.gov/media/142435/download This test is not yet  approved or cleared by the [place] and  has been authorized for detection and/or diagnosis of SARS-CoV-2 by FDA under an Emergency Use Authorization (EUA).  This EUA will remain in effect (meaning this test can be used) fo r the duration of  the COVID-19 declaration under Section 564(b)(1) of the Act, 21 U.S.C. section 360bbb-3(b)(1), unless the authorization is terminated or revoked sooner.    Influenza A by PCR NEGATIVE NEGATIVE Final   Influenza B by PCR NEGATIVE NEGATIVE Final    Comment: (NOTE) The Xpert Xpress SARS-CoV-2/FLU/RSV assay is intended as an aid in  the diagnosis of influenza from Nasopharyngeal swab specimens and  should not be used as a sole basis for treatment. Nasal washings and  aspirates are unacceptable for Xpert Xpress SARS-CoV-2/FLU/RSV  testing. Fact Sheet for Patients: PinkCheek.be Fact Sheet for Healthcare Providers: GravelBags.it This test is not yet approved or cleared by the [place] FDA and  has been authorized for detection and/or diagnosis of  SARS-CoV-2 by  FDA under an Emergency Use Authorization (EUA). This EUA will remain  in effect (meaning this test can be used) for the duration of the  Covid-19 declaration under Section 564(b)(1) of the Act, 21  U.S.C. section 360bbb-3(b)(1), unless the authorization is  terminated or revoked. Performed at [facility], [address], [place], [place] [address]       Radiology Studies: [facility] Chest [place] 1 View  Result Date: 12/06/2019 CLINICAL DATA:  Shortness of breath, fever EXAM: PORTABLE CHEST 1 VIEW COMPARISON:  None. FINDINGS: The heart size and mediastinal contours are within normal limits. Patchy/streaky airspace disease seen within the right lower lung and left lower lung. There is mildly increased interstitial markings seen throughout both lungs. No acute osseous abnormality. IMPRESSION: Airspace opacities seen within both lungs which could be due to viral infectious etiology and/or asymmetric edema is Electronically Signed   By: Prudencio Pair M.D.   On: 12/06/2019 21:01   CT Renal Stone Study  Result Date: 12/06/2019 CLINICAL DATA:  Flank pain, COVID positive EXAM: CT ABDOMEN AND PELVIS WITHOUT CONTRAST TECHNIQUE: Multidetector CT imaging of the abdomen and pelvis was performed following the standard protocol without IV contrast. COMPARISON:  None FINDINGS: Lower chest: The visualized heart size within normal limits. No pericardial fluid/thickening. No hiatal hernia. Multifocal patchy/ground-glass opacity seen at both lung bases. Hepatobiliary: Although limited due to the lack of intravenous contrast, normal in appearance without gross focal abnormality. No evidence of calcified gallstones or biliary ductal dilatation. Pancreas:  Unremarkable.  No surrounding inflammatory changes. Spleen: Normal in size. Although limited due to the lack of intravenous contrast, normal in appearance. Adrenals/Urinary Tract: Both adrenal glands appear normal. The kidneys and collecting system  appear normal without evidence of urinary tract calculus or hydronephrosis. Bladder is unremarkable. Stomach/Bowel: The stomach, small bowel, and colon are normal in appearance. No inflammatory changes or obstructive findings. Vascular/Lymphatic: There are no enlarged abdominal or pelvic lymph nodes. Scattered aortic atherosclerotic calcifications are seen without aneurysmal dilatation. Reproductive: The uterus and adnexa are unremarkable. Other: No evidence of abdominal wall mass or hernia. Musculoskeletal: No acute or significant osseous findings. IMPRESSION: Multifocal patchy/ground-glass opacities throughout both lungs, consistent with COVID pneumonia. No acute intra-abdominal or pelvic pathology. Aortic Atherosclerosis (ICD10-I70.0). Electronically Signed   By: Prudencio Pair M.D.   On: 12/06/2019 22:29     Scheduled Meds:  albuterol  2 puff Inhalation Q6H   vitamin C  500 mg Oral Daily   chlorhexidine  15 mL Mouth Rinse BID   dexamethasone (DECADRON) injection  6 mg Intravenous q1800   [START ON 12/08/2019] enoxaparin (LOVENOX) injection  55 mg Subcutaneous Q24H   mouth rinse  15 mL Mouth Rinse q12n4p   metoprolol succinate  50 mg Oral Daily   multivitamin with minerals  1 tablet Oral Daily   zinc sulfate  220 mg Oral Daily   Continuous Infusions:  sodium chloride Stopped (12/07/19 1549)   [START ON 12/08/2019] remdesivir 100 mg in NS 100 mL       LOS: 1 day     Darlin Priestly, MD [facility] If 7PM-7AM, please contact night-coverage 12/07/2019, 6:31 PM

## 2019-12-08 LAB — FERRITIN: Ferritin: 126 ng/mL (ref 11–307)

## 2019-12-08 LAB — COMPREHENSIVE METABOLIC PANEL
ALT: 20 U/L (ref 0–44)
AST: 25 U/L (ref 15–41)
Albumin: 2.9 g/dL — ABNORMAL LOW (ref 3.5–5.0)
Alkaline Phosphatase: 51 U/L (ref 38–126)
Anion gap: 8 (ref 5–15)
BUN: 19 mg/dL (ref 6–20)
CO2: 26 mmol/L (ref 22–32)
Calcium: 8.2 mg/dL — ABNORMAL LOW (ref 8.9–10.3)
Chloride: 105 mmol/L (ref 98–111)
Creatinine, Ser: 1.14 mg/dL — ABNORMAL HIGH (ref 0.44–1.00)
GFR calc Af Amer: >60 mL/min (ref >60)
GFR calc non Af Amer: 54 mL/min — ABNORMAL LOW (ref >60)
Glucose, Bld: 162 mg/dL — ABNORMAL HIGH (ref 70–99)
Potassium: 4.3 mmol/L (ref 3.5–5.1)
Sodium: 139 mmol/L (ref 135–145)
Total Bilirubin: 0.5 mg/dL (ref 0.3–1.2)
Total Protein: 6.6 g/dL (ref 6.5–8.1)

## 2019-12-08 LAB — MAGNESIUM: Magnesium: 2.4 mg/dL (ref 1.7–2.4)

## 2019-12-08 LAB — PHOSPHORUS: Phosphorus: 4 mg/dL (ref 2.5–4.6)

## 2019-12-08 LAB — CBC WITH DIFFERENTIAL/PLATELET
Abs Immature Granulocytes: 0.03 10*3/uL (ref 0.00–0.07)
Basophils Absolute: 0 10*3/uL (ref 0.0–0.1)
Basophils Relative: 0 %
Eosinophils Absolute: 0 10*3/uL (ref 0.0–0.5)
Eosinophils Relative: 0 %
HCT: 35.8 % — ABNORMAL LOW (ref 36.0–46.0)
Hemoglobin: 12.3 g/dL (ref 12.0–15.0)
Immature Granulocytes: 0 %
Lymphocytes Relative: 13 %
Lymphs Abs: 0.9 10*3/uL (ref 0.7–4.0)
MCH: 27.3 pg (ref 26.0–34.0)
MCHC: 34.4 g/dL (ref 30.0–36.0)
MCV: 79.4 fL — ABNORMAL LOW (ref 80.0–100.0)
Monocytes Absolute: 0.2 10*3/uL (ref 0.1–1.0)
Monocytes Relative: 3 %
Neutro Abs: 5.7 10*3/uL (ref 1.7–7.7)
Neutrophils Relative %: 84 %
Platelets: 220 10*3/uL (ref 150–400)
RBC: 4.51 MIL/uL (ref 3.87–5.11)
RDW: 13.2 % (ref 11.5–15.5)
Smear Review: NORMAL
WBC: 6.8 10*3/uL (ref 4.0–10.5)
nRBC: 0 % (ref 0.0–0.2)

## 2019-12-08 LAB — C-REACTIVE PROTEIN: CRP: 5.2 mg/dL — ABNORMAL HIGH (ref <1.0)

## 2019-12-08 NOTE — Progress Notes (Signed)
PROGRESS NOTE    Mary Villegas  MCN:[id number] DOB: 1966/09/06 DOA: 12/06/2019 PCP: Virginia Crews, MD    Assessment & Plan:   Principal Problem:   Acute respiratory failure with hypoxia [facility]) Active Problems:   Essential hypertension   Pneumonia due to COVID-19 virus   AKI (acute kidney injury) ([place])   UTI (urinary tract infection)    Mary Villegas is a 53 y.o. Caucasian  female with history of essential hypertension who tested positive for COVID-19 on 12/03/19 with symptoms since 11/26/19, presented complaining of N/V/D, progressive weakness, dyspnea and 'pain in her ovaries'.   Acute respiratory failure with hypoxia (HCC) secondary to Covid 19 pneumonia  --Patient with O2 sat mid 80's on RA, and needed 2L O2. --CT abdomen and pelvis showed multifocal patchy/groundglass opacities throughout both lungs consistent with Covid pneumonia PLAN: --Supplemental oxygen to keep sats over 90%, wean as tolerated --Remdesivir, steroids, multivitamins as ordered --Follow inflammatory markers --incentive spirometry (crackles at bases)  Mild elevation in Cr, not meeting AKI criteria --Creatinine 1.23, (most recent around 1.1), secondary to dehydration from nausea and vomiting --Patient got a small 500 mL bolus of normal saline in the emergency room.  --CT abdomen and pelvis did not show any acute intra-abdominal or pelvic pathology  Acute gastroenteritis, resolved --related most likely to COVID-19 infection.  CT abdomen and pelvis showed no acute pathology --Antiemetics --Hold IVF and encourage PO hydration  Essential hypertension --Continue home metoprolol   DVT prophylaxis: Lovenox SQ Code Status: Full code  Family Communication: Not today Disposition Plan: Home   Subjective and Interval History:  Pt reported feeling much better today, most of her presenting symptoms have resolved, no N/V/D, and no more "ovary pain".  The dysuria she had had for the past  1-2 month also resolved.  No pain.  Ate well.  Only still feels weak.   Objective: Vitals:   12/08/19 0526 12/08/19 0528 12/08/19 1050 12/08/19 1150  BP:  (!) 124/56 116/61 (!) 111/43  Pulse:  75 78 85  Resp:  (!) 22 20 18   Temp:  97.9 F (36.6 C) 98.4 F (36.9 C) 98 F (36.7 C)  TempSrc:  Oral Oral Oral  SpO2: 90% 100% 93% 94%  Weight:      Height:        Intake/Output Summary (Last 24 hours) at 12/08/2019 1339 Last data filed at 12/07/2019 1841 Gross per 24 hour  Intake 542.03 ml  Output -  Net 542.03 ml   Filed Weights   12/06/19 2011  Weight: 108.9 kg    Examination:   Constitutional: NAD, AAOx3 HEENT: conjunctivae and lids normal, EOMI CV: RRR no M,R,G. Distal pulses +2.  No cyanosis.  RESP: crackles over posterior lung bases, normal work of breathing today, on 2L GI: +BS, ND, NT Extremities: No effusions, edema, or tenderness in BLE SKIN: warm, dry and intact Neuro: II - XII grossly intact.  Sensation intact Psych: better mood and affect.     Data Reviewed: I have personally reviewed following labs and imaging studies  CBC: Recent Labs  Lab 12/06/19 2037 12/07/19 0110 12/07/19 0459 12/08/19 0436  WBC 4.9 6.4 5.6 6.8  NEUTROABS 3.7  --  4.7 5.7  HGB 12.9 12.4 13.4 12.3  HCT 36.9 36.1 39.4 35.8*  MCV 79.0* 79.3* 79.8* 79.4*  PLT 195 186 196 629   Basic Metabolic Panel: Recent Labs  Lab 12/06/19 2037 12/07/19 0459 12/08/19 0436  NA 136 140 139  K 3.8 3.9  4.3  CL 102 103 105  CO2 GLUCOSE 128* 158* 162*  BUN CREATININE 1.23* 1.15* 1.14*  CALCIUM 7.8* 8.1* 8.2*  MG 1.8 1.9 2.4  PHOS  --  3.7 4.0   GFR: Estimated Creatinine Clearance: 68.5 mL/min (A) (by C-G formula based on SCr of 1.14 mg/dL (H)). Liver Function Tests: Recent Labs  Lab 12/06/19 2037 12/07/19 0459 12/08/19 0436  AST ALT ALKPHOS 59 60 51  BILITOT 0.6 0.4 0.5  PROT 6.7 7.0 6.6  ALBUMIN 3.3* 3.3* 2.9*   No results for  input(s): LIPASE, AMYLASE in the last 168 hours. No results for input(s): AMMONIA in the last 168 hours. Coagulation Profile: No results for input(s): INR, PROTIME in the last 168 hours. Cardiac Enzymes: No results for input(s): CKTOTAL, CKMB, CKMBINDEX, TROPONINI in the last 168 hours. BNP (last 3 results) No results for input(s): PROBNP in the last 8760 hours. HbA1C: No results for input(s): HGBA1C in the last 72 hours. CBG: No results for input(s): GLUCAP in the last 168 hours. Lipid Profile: Recent Labs    12/06/19 2043  TRIG 106   Thyroid Function Tests: No results for input(s): TSH, T4TOTAL, FREET4, T3FREE, THYROIDAB in the last 72 hours. Anemia Panel: Recent Labs    12/07/19 0459 12/08/19 0436  FERRITIN 106 126   Sepsis Labs: Recent Labs  Lab 12/06/19 2037 12/06/19 2043 12/07/19 0110  PROCALCITON <0.10  --   --   LATICACIDVEN  --  1.0 0.9    Recent Results (from the past 240 hour(s))  Blood Culture (routine x 2)     Status: None (Preliminary result)   Collection Time: 12/06/19  8:43 PM   Specimen: BLOOD  Result Value Ref Range Status   Specimen Description BLOOD FATTY CASTS  Final   Special Requests   Final    BOTTLES DRAWN AEROBIC AND ANAEROBIC Blood Culture adequate volume   Culture   Final    NO GROWTH 2 DAYS Performed at [facility], [address], [place], [place] [address]    Report Status PENDING  Incomplete  Blood Culture (routine x 2)     Status: None (Preliminary result)   Collection Time: 12/06/19  8:48 PM   Specimen: BLOOD LEFT HAND  Result Value Ref Range Status   Specimen Description BLOOD LEFT HAND  Final   Special Requests Blood Culture adequate volume  Final   Culture   Final    NO GROWTH 2 DAYS Performed at [facility], [address], [place], [place] [address]    Report Status PENDING  Incomplete  Urine culture     Status: Abnormal (Preliminary result)   Collection Time: 12/06/19  9:07 PM   Specimen:  Urine, Clean Catch  Result Value Ref Range Status   Specimen Description   Final    URINE, CLEAN CATCH Performed at [facility], [address], [place], [place] [address]    Special Requests   Final    NONE Performed at [facility], [address] [place], [place] [address]    Culture >=100,000 COLONIES/mL GRAM NEGATIVE RODS (A)  Final   Report Status PENDING  Incomplete  Respiratory Panel by RT PCR (Flu A&B, Covid) - Nasopharyngeal Swab     Status: Abnormal   Collection Time: 12/06/19 11:03 PM   Specimen: Nasopharyngeal Swab  Result Value Ref Range Status   SARS Coronavirus 2 by  RT PCR POSITIVE (A) NEGATIVE Final    Comment: RESULT CALLED TO, READ BACK BY AND VERIFIED WITH: PAIGE JOHNSON 12/06/19 @ 2358  MLK (NOTE) SARS-CoV-2 target nucleic acids are DETECTED. SARS-CoV-2 RNA is generally detectable in upper respiratory specimens  during the acute phase of infection. Positive results are indicative of the presence of the identified virus, but do not rule out bacterial infection or co-infection with other pathogens not detected by the test. Clinical correlation with patient history and other diagnostic information is necessary to determine patient infection status. The expected result is Negative. Fact Sheet for Patients:  https://www.moore.com/https://www.fda.gov/media/142436/download Fact Sheet for Healthcare Providers: https://www.young.biz/https://www.fda.gov/media/142435/download This test is not yet approved or cleared by the [place] FDA and  has been authorized for detection and/or diagnosis of SARS-CoV-2 by FDA under an Emergency Use Authorization (EUA).  This EUA will remain in effect (meaning this test can be used) fo r the duration of  the COVID-19 declaration under Section 564(b)(1) of the Act, 21 U.S.C. section 360bbb-3(b)(1), unless the authorization is terminated or revoked sooner.    Influenza A by PCR NEGATIVE NEGATIVE Final   Influenza B by PCR NEGATIVE NEGATIVE Final     Comment: (NOTE) The Xpert Xpress SARS-CoV-2/FLU/RSV assay is intended as an aid in  the diagnosis of influenza from Nasopharyngeal swab specimens and  should not be used as a sole basis for treatment. Nasal washings and  aspirates are unacceptable for Xpert Xpress SARS-CoV-2/FLU/RSV  testing. Fact Sheet for Patients: https://www.moore.com/https://www.fda.gov/media/142436/download Fact Sheet for Healthcare Providers: https://www.young.biz/https://www.fda.gov/media/142435/download This test is not yet approved or cleared by the [place] FDA and  has been authorized for detection and/or diagnosis of SARS-CoV-2 by  FDA under an Emergency Use Authorization (EUA). This EUA will remain  in effect (meaning this test can be used) for the duration of the  Covid-19 declaration under Section 564(b)(1) of the Act, 21  U.S.C. section 360bbb-3(b)(1), unless the authorization is  terminated or revoked. Performed at [facility], [address] [place], [place] [address]       [facility]  Result Date: 12/06/2019 CLINICAL DATA:  Shortness of breath, fever EXAM: PORTABLE CHEST 1 VIEW COMPARISON:  None. FINDINGS: The heart size and mediastinal contours are within normal limits. Patchy/streaky airspace disease seen within the right lower lung and left lower lung. There is mildly increased interstitial markings seen throughout both lungs. No acute osseous abnormality. IMPRESSION: Airspace opacities seen within both lungs which could be due to viral infectious etiology and/or asymmetric edema is Electronically Signed   By: Jonna ClarkBindu  Avutu M.D.   On: 12/06/2019 21:01   CT Renal Stone Study  Result Date: 12/06/2019 CLINICAL DATA:  Flank pain, COVID positive EXAM: CT ABDOMEN AND PELVIS WITHOUT CONTRAST TECHNIQUE: Multidetector CT imaging of the abdomen and pelvis was performed following the standard protocol without IV contrast. COMPARISON:  None FINDINGS: Lower chest: The visualized heart size within normal  limits. No pericardial fluid/thickening. No hiatal hernia. Multifocal patchy/ground-glass opacity seen at both lung bases. Hepatobiliary: Although limited due to the lack of intravenous contrast, normal in appearance without gross focal abnormality. No evidence of calcified gallstones or biliary ductal dilatation. Pancreas:  Unremarkable.  No surrounding inflammatory changes. Spleen: Normal in size. Although limited due to the lack of intravenous contrast, normal in appearance. Adrenals/Urinary Tract: Both adrenal glands appear normal. The kidneys and collecting system appear normal without evidence of urinary tract calculus or hydronephrosis. Bladder is unremarkable. Stomach/Bowel: The stomach, small  bowel, and colon are normal in appearance. No inflammatory changes or obstructive findings. Vascular/Lymphatic: There are no enlarged abdominal or pelvic lymph nodes. Scattered aortic atherosclerotic calcifications are seen without aneurysmal dilatation. Reproductive: The uterus and adnexa are unremarkable. Other: No evidence of abdominal wall mass or hernia. Musculoskeletal: No acute or significant osseous findings. IMPRESSION: Multifocal patchy/ground-glass opacities throughout both lungs, consistent with COVID pneumonia. No acute intra-abdominal or pelvic pathology. Aortic Atherosclerosis (ICD10-I70.0). Electronically Signed   By: Jonna Clark M.D.   On: 12/06/2019 22:29     Scheduled Meds: . albuterol  2 puff Inhalation Q6H  . vitamin C  500 mg Oral Daily  . chlorhexidine  15 mL Mouth Rinse BID  . dexamethasone (DECADRON) injection  6 mg Intravenous q1800  . enoxaparin (LOVENOX) injection  55 mg Subcutaneous Q24H  . mouth rinse  15 mL Mouth Rinse q12n4p  . metoprolol succinate  50 mg Oral Daily  . multivitamin with minerals  1 tablet Oral Daily  . zinc sulfate  220 mg Oral Daily   Continuous Infusions: . sodium chloride 10 mL/hr at 12/08/19 1056  . remdesivir 100 mg in NS 100 mL 100 mg (12/08/19  1059)     LOS: 2 days     Darlin Priestly, MD [facility] If 7PM-7AM, please contact night-coverage 12/08/2019, 1:39 PM

## 2019-12-09 LAB — COMPREHENSIVE METABOLIC PANEL
ALT: 20 U/L (ref 0–44)
AST: 27 U/L (ref 15–41)
Albumin: 3 g/dL — ABNORMAL LOW (ref 3.5–5.0)
Alkaline Phosphatase: 50 U/L (ref 38–126)
Anion gap: 11 (ref 5–15)
BUN: 21 mg/dL — ABNORMAL HIGH (ref 6–20)
CO2: 22 mmol/L (ref 22–32)
Calcium: 8.5 mg/dL — ABNORMAL LOW (ref 8.9–10.3)
Chloride: 104 mmol/L (ref 98–111)
Creatinine, Ser: 0.95 mg/dL (ref 0.44–1.00)
GFR calc Af Amer: >60 mL/min (ref >60)
GFR calc non Af Amer: >60 mL/min (ref >60)
Glucose, Bld: 161 mg/dL — ABNORMAL HIGH (ref 70–99)
Potassium: 3.9 mmol/L (ref 3.5–5.1)
Sodium: 137 mmol/L (ref 135–145)
Total Bilirubin: 0.6 mg/dL (ref 0.3–1.2)
Total Protein: 6.7 g/dL (ref 6.5–8.1)

## 2019-12-09 LAB — CBC WITH DIFFERENTIAL/PLATELET
Abs Immature Granulocytes: 0.04 10*3/uL (ref 0.00–0.07)
Basophils Absolute: 0 10*3/uL (ref 0.0–0.1)
Basophils Relative: 0 %
Eosinophils Absolute: 0 10*3/uL (ref 0.0–0.5)
Eosinophils Relative: 0 %
HCT: 37.9 % (ref 36.0–46.0)
Hemoglobin: 12.7 g/dL (ref 12.0–15.0)
Immature Granulocytes: 1 %
Lymphocytes Relative: 21 %
Lymphs Abs: 1.1 10*3/uL (ref 0.7–4.0)
MCH: 27.7 pg (ref 26.0–34.0)
MCHC: 33.5 g/dL (ref 30.0–36.0)
MCV: 82.6 fL (ref 80.0–100.0)
Monocytes Absolute: 0.2 10*3/uL (ref 0.1–1.0)
Monocytes Relative: 4 %
Neutro Abs: 4.1 10*3/uL (ref 1.7–7.7)
Neutrophils Relative %: 74 %
Platelets: 272 10*3/uL (ref 150–400)
RBC: 4.59 MIL/uL (ref 3.87–5.11)
RDW: 13.2 % (ref 11.5–15.5)
Smear Review: NORMAL
WBC: 5.5 10*3/uL (ref 4.0–10.5)
nRBC: 0 % (ref 0.0–0.2)

## 2019-12-09 LAB — URINE CULTURE: Culture: >=100000 — AB

## 2019-12-09 LAB — C-REACTIVE PROTEIN: CRP: 2.6 mg/dL — ABNORMAL HIGH (ref <1.0)

## 2019-12-09 LAB — PHOSPHORUS: Phosphorus: 4.2 mg/dL (ref 2.5–4.6)

## 2019-12-09 LAB — FERRITIN: Ferritin: 116 ng/mL (ref 11–307)

## 2019-12-09 LAB — MAGNESIUM: Magnesium: 2.2 mg/dL (ref 1.7–2.4)

## 2019-12-09 MED ORDER — NITROFURANTOIN MONOHYD MACRO 100 MG PO CAPS
100.0000 mg | ORAL_CAPSULE | Freq: Two times a day (BID) | ORAL | Status: DC
  Administered 2019-12-09 – 2019-12-10 (×2): 100 mg via ORAL
  Filled 2019-12-09 (×3): qty 1

## 2019-12-09 NOTE — Progress Notes (Signed)
PROGRESS NOTE    Mary Villegas  ZOX:[id number] DOB: 12-Feb-1966 DOA: 12/06/2019 PCP: Erasmo Downer, MD    Assessment & Plan:   Principal Problem:   Acute respiratory failure with hypoxia [facility]) Active Problems:   Essential hypertension   Pneumonia due to COVID-19 virus   AKI (acute kidney injury) (HCC)   UTI (urinary tract infection)    [name] is a 53 y.o. Caucasian  female with history of essential hypertension who tested positive for COVID-19 on 12/03/19 with symptoms since 11/26/19, presented complaining of N/V/D, progressive weakness, dyspnea and 'pain in her ovaries'.   Acute respiratory failure with hypoxia (HCC) secondary to Covid 19 pneumonia  --Patient with O2 sat mid 80's on RA, and needed 2L O2. --CT abdomen and pelvis showed multifocal patchy/groundglass opacities throughout both lungs consistent with Covid pneumonia --O2 requirement down to 1L today. PLAN: --Supplemental oxygen to keep sats over 90%, wean as tolerated --Remdesivir, steroids, multivitamins as ordered --Follow inflammatory markers --incentive spirometry (crackles at bases)  Mild elevation in Cr, not meeting AKI criteria --Creatinine 1.23, (most recent around 1.1), secondary to dehydration from nausea and vomiting --Patient got a small 500 mL bolus of normal saline in the emergency room.  --CT abdomen and pelvis did not show any acute intra-abdominal or pelvic pathology  Acute gastroenteritis, resolved --related most likely to COVID-19 infection.  CT abdomen and pelvis showed no acute pathology --Antiemetics --Hold IVF and encourage PO hydration  Essential hypertension --Continue home metoprolol  UTI, POA --Pt complained of "ovary pain" and dysuria that had resolved by Day 3 of hospitalization, likely due to UTI being treated by empiric ceftriaxone given on presentation.  UA on presentation was neg nitrite/neg leuk, however, urine cx grew E coli  pan-sensitive. PLAN: --Start macrobid to finish a course of treatment for UTI   DVT prophylaxis: Lovenox SQ Code Status: Full code  Family Communication: Not today Disposition Plan: Home tomorrow   Subjective and Interval History:  Still has some DOE, but otherwise doing better.  Reported having night sweats.  No fever, chest pain, abdominal pain, N/V/D, dysuria, increased swelling.   Objective: Vitals:   12/08/19 1538 12/08/19 2147 12/09/19 0514 12/09/19 1209  BP:  137/74 (!) 142/72 132/68  Pulse: 72 76 66 71  Resp:  20 (!) 32 18  Temp:  98 F (36.7 C) 97.7 F (36.5 C) 97.8 F (36.6 C)  TempSrc:  Oral Oral Oral  SpO2: 95% 94% 91% 94%  Weight:      Height:        Intake/Output Summary (Last 24 hours) at 12/09/2019 1700 Last data filed at 12/09/2019 1124 Gross per 24 hour  Intake 521.98 ml  Output 1550 ml  Net -1028.02 ml   Filed Weights   12/06/19 2011  Weight: 108.9 kg    Examination:   Constitutional: NAD, AAOx3 HEENT: conjunctivae and lids normal, EOMI CV: RRR no M,R,G. Distal pulses +2.  No cyanosis.  RESP: Loud crackles over posterior right mid to low lung fields, normal work of breathing today, on 1L GI: +BS, ND, NT Extremities: No effusions, edema, or tenderness in BLE SKIN: warm, dry and intact Neuro: II - XII grossly intact.  Sensation intact Psych: better mood and affect.     Data Reviewed: I have personally reviewed following labs and imaging studies  CBC: Recent Labs  Lab 12/06/19 2037 12/07/19 0110 12/07/19 0459 12/08/19 0436 12/09/19 0517  WBC 4.9 6.4 5.6 6.8 5.5  NEUTROABS 3.7  --  4.7 5.7 4.1  HGB 12.9 12.4 13.4 12.3 12.7  HCT 36.9 36.1 39.4 35.8* 37.9  MCV 79.0* 79.3* 79.8* 79.4* 82.6  PLT 195 186 196 220 272   Basic Metabolic Panel: Recent Labs  Lab 12/06/19 2037 12/07/19 0459 12/08/19 0436 12/09/19 0517  NA 136 140 139 137  K 3.8 3.9 4.3 3.9  CL 102 103 105 104  CO2 24 28 26 22   GLUCOSE 128* 158* 162* 161*  BUN 12  12 19  21*  CREATININE 1.23* 1.15* 1.14* 0.95  CALCIUM 7.8* 8.1* 8.2* 8.5*  MG 1.8 1.9 2.4 2.2  PHOS  --  3.7 4.0 4.2   GFR: Estimated Creatinine Clearance: 82.2 mL/min (by C-G formula based on SCr of 0.95 mg/dL). Liver Function Tests: Recent Labs  Lab 12/06/19 2037 12/07/19 0459 12/08/19 0436 12/09/19 0517  AST 28 31 25 27   ALT 26 24 20 20   ALKPHOS 59 60 51 50  BILITOT 0.6 0.4 0.5 0.6  PROT 6.7 7.0 6.6 6.7  ALBUMIN 3.3* 3.3* 2.9* 3.0*   No results for input(s): LIPASE, AMYLASE in the last 168 hours. No results for input(s): AMMONIA in the last 168 hours. Coagulation Profile: No results for input(s): INR, PROTIME in the last 168 hours. Cardiac Enzymes: No results for input(s): CKTOTAL, CKMB, CKMBINDEX, TROPONINI in the last 168 hours. BNP (last 3 results) No results for input(s): PROBNP in the last 8760 hours. HbA1C: No results for input(s): HGBA1C in the last 72 hours. CBG: No results for input(s): GLUCAP in the last 168 hours. Lipid Profile: Recent Labs    12/06/19 2043  TRIG 106   Thyroid Function Tests: No results for input(s): TSH, T4TOTAL, FREET4, T3FREE, THYROIDAB in the last 72 hours. Anemia Panel: Recent Labs    12/08/19 0436 12/09/19 0517  FERRITIN 126 116   Sepsis Labs: Recent Labs  Lab 12/06/19 2037 12/06/19 2043 12/07/19 0110  PROCALCITON <0.10  --   --   LATICACIDVEN  --  1.0 0.9    Recent Results (from the past 240 hour(s))  Blood Culture (routine x 2)     Status: None (Preliminary result)   Collection Time: 12/06/19  8:43 PM   Specimen: BLOOD  Result Value Ref Range Status   Specimen Description BLOOD FATTY CASTS  Final   Special Requests   Final    BOTTLES DRAWN AEROBIC AND ANAEROBIC Blood Culture adequate volume   Culture   Final    NO GROWTH 3 DAYS Performed at [facility], [address] [place], 12/08/19 [facility]    Report Status PENDING  Incomplete  Blood Culture (routine x 2)     Status: None (Preliminary  result)   Collection Time: 12/06/19  8:48 PM   Specimen: BLOOD LEFT HAND  Result Value Ref Range Status   Specimen Description BLOOD LEFT HAND  Final   Special Requests Blood Culture adequate volume  Final   Culture   Final    NO GROWTH 3 DAYS Performed at [facility], [address], [place], 12/08/19 [facility]    Report Status PENDING  Incomplete  Urine culture     Status: Abnormal   Collection Time: 12/06/19  9:07 PM   Specimen: Urine, Clean Catch  Result Value Ref Range Status   Specimen Description   Final    URINE, CLEAN CATCH Performed at [facility], [address] [place], 12/08/19 [facility]    Special Requests   Final    NONE Performed at  [facility], [address] [place], [place] [address]    Culture >=100,000 COLONIES/mL ESCHERICHIA COLI (A)  Final   Report Status 12/09/2019 FINAL  Final   Organism ID, Bacteria ESCHERICHIA COLI (A)  Final      Susceptibility   Escherichia coli - MIC*    AMPICILLIN 8 SENSITIVE Sensitive     CEFAZOLIN <=4 SENSITIVE Sensitive     CEFTRIAXONE <=1 SENSITIVE Sensitive     CIPROFLOXACIN <=0.25 SENSITIVE Sensitive     GENTAMICIN <=1 SENSITIVE Sensitive     IMIPENEM <=0.25 SENSITIVE Sensitive     NITROFURANTOIN <=16 SENSITIVE Sensitive     TRIMETH/SULFA <=20 SENSITIVE Sensitive     AMPICILLIN/SULBACTAM 4 SENSITIVE Sensitive     PIP/TAZO <=4 SENSITIVE Sensitive     * >=100,000 COLONIES/mL ESCHERICHIA COLI  Respiratory Panel by RT PCR (Flu A&B, Covid) - Nasopharyngeal Swab     Status: Abnormal   Collection Time: 12/06/19 11:03 PM   Specimen: Nasopharyngeal Swab  Result Value Ref Range Status   SARS Coronavirus 2 by RT PCR POSITIVE (A) NEGATIVE Final    Comment: RESULT CALLED TO, READ BACK BY AND VERIFIED WITH: PAIGE JOHNSON 12/06/19 @ 2358  MLK (NOTE) SARS-CoV-2 target nucleic acids are DETECTED. SARS-CoV-2 RNA is generally detectable in upper respiratory specimens  during the acute phase of  infection. Positive results are indicative of the presence of the identified virus, but do not rule out bacterial infection or co-infection with other pathogens not detected by the test. Clinical correlation with patient history and other diagnostic information is necessary to determine patient infection status. The expected result is Negative. Fact Sheet for Patients:  https://www.moore.com/https://www.fda.gov/media/142436/download Fact Sheet for Healthcare Providers: https://www.young.biz/https://www.fda.gov/media/142435/download This test is not yet approved or cleared by the [place] FDA and  has been authorized for detection and/or diagnosis of SARS-CoV-2 by FDA under an Emergency Use Authorization (EUA).  This EUA will remain in effect (meaning this test can be used) fo r the duration of  the COVID-19 declaration under Section 564(b)(1) of the Act, 21 U.S.C. section 360bbb-3(b)(1), unless the authorization is terminated or revoked sooner.    Influenza A by PCR NEGATIVE NEGATIVE Final   Influenza B by PCR NEGATIVE NEGATIVE Final    Comment: (NOTE) The Xpert Xpress SARS-CoV-2/FLU/RSV assay is intended as an aid in  the diagnosis of influenza from Nasopharyngeal swab specimens and  should not be used as a sole basis for treatment. Nasal washings and  aspirates are unacceptable for Xpert Xpress SARS-CoV-2/FLU/RSV  testing. Fact Sheet for Patients: https://www.moore.com/https://www.fda.gov/media/142436/download Fact Sheet for Healthcare Providers: https://www.young.biz/https://www.fda.gov/media/142435/download This test is not yet approved or cleared by the [place] FDA and  has been authorized for detection and/or diagnosis of SARS-CoV-2 by  FDA under an Emergency Use Authorization (EUA). This EUA will remain  in effect (meaning this test can be used) for the duration of the  Covid-19 declaration under Section 564(b)(1) of the Act, 21  U.S.C. section 360bbb-3(b)(1), unless the authorization is  terminated or revoked. Performed at [facility],  [address] [place], [place] [address]       Radiology Studies: No results found.   Scheduled Meds: . albuterol  2 puff Inhalation Q6H  . vitamin C  500 mg Oral Daily  . chlorhexidine  15 mL Mouth Rinse BID  . dexamethasone (DECADRON) injection  6 mg Intravenous q1800  . enoxaparin (LOVENOX) injection  55 mg Subcutaneous Q24H  . mouth rinse  15 mL Mouth Rinse q12n4p  .  metoprolol succinate  50 mg Oral Daily  . multivitamin with minerals  1 tablet Oral Daily  . zinc sulfate  220 mg Oral Daily   Continuous Infusions: . sodium chloride 10 mL/hr at 12/09/19 1044  . remdesivir 100 mg in NS 100 mL Stopped (12/09/19 0950)     LOS: 3 days     Enzo Bi, MD [facility] If 7PM-7AM, please contact night-coverage 12/09/2019, 5:00 PM

## 2019-12-10 LAB — COMPREHENSIVE METABOLIC PANEL
ALT: 30 U/L (ref 0–44)
AST: 36 U/L (ref 15–41)
Albumin: 3.1 g/dL — ABNORMAL LOW (ref 3.5–5.0)
Alkaline Phosphatase: 56 U/L (ref 38–126)
Anion gap: 12 (ref 5–15)
BUN: 22 mg/dL — ABNORMAL HIGH (ref 6–20)
CO2: 24 mmol/L (ref 22–32)
Calcium: 8.8 mg/dL — ABNORMAL LOW (ref 8.9–10.3)
Chloride: 103 mmol/L (ref 98–111)
Creatinine, Ser: 0.99 mg/dL (ref 0.44–1.00)
GFR calc Af Amer: >60 mL/min (ref >60)
GFR calc non Af Amer: >60 mL/min (ref >60)
Glucose, Bld: 177 mg/dL — ABNORMAL HIGH (ref 70–99)
Potassium: 4.3 mmol/L (ref 3.5–5.1)
Sodium: 139 mmol/L (ref 135–145)
Total Bilirubin: 0.5 mg/dL (ref 0.3–1.2)
Total Protein: 7.3 g/dL (ref 6.5–8.1)

## 2019-12-10 LAB — CBC WITH DIFFERENTIAL/PLATELET
Abs Immature Granulocytes: 0.07 10*3/uL (ref 0.00–0.07)
Basophils Absolute: 0 10*3/uL (ref 0.0–0.1)
Basophils Relative: 0 %
Eosinophils Absolute: 0 10*3/uL (ref 0.0–0.5)
Eosinophils Relative: 0 %
HCT: 40 % (ref 36.0–46.0)
Hemoglobin: 13.8 g/dL (ref 12.0–15.0)
Immature Granulocytes: 1 %
Lymphocytes Relative: 27 %
Lymphs Abs: 1.8 10*3/uL (ref 0.7–4.0)
MCH: 27.3 pg (ref 26.0–34.0)
MCHC: 34.5 g/dL (ref 30.0–36.0)
MCV: 79.1 fL — ABNORMAL LOW (ref 80.0–100.0)
Monocytes Absolute: 0.3 10*3/uL (ref 0.1–1.0)
Monocytes Relative: 4 %
Neutro Abs: 4.6 10*3/uL (ref 1.7–7.7)
Neutrophils Relative %: 68 %
Platelets: 369 10*3/uL (ref 150–400)
RBC: 5.06 MIL/uL (ref 3.87–5.11)
RDW: 13.2 % (ref 11.5–15.5)
Smear Review: NORMAL
WBC: 6.8 10*3/uL (ref 4.0–10.5)
nRBC: 0 % (ref 0.0–0.2)

## 2019-12-10 LAB — FERRITIN: Ferritin: 100 ng/mL (ref 11–307)

## 2019-12-10 LAB — PHOSPHORUS: Phosphorus: 4.7 mg/dL — ABNORMAL HIGH (ref 2.5–4.6)

## 2019-12-10 LAB — MAGNESIUM: Magnesium: 2.2 mg/dL (ref 1.7–2.4)

## 2019-12-10 LAB — C-REACTIVE PROTEIN: CRP: 1.4 mg/dL — ABNORMAL HIGH (ref <1.0)

## 2019-12-10 MED ORDER — ALBUTEROL SULFATE HFA 108 (90 BASE) MCG/ACT IN AERS: 2.0000 | INHALATION_SPRAY | Freq: Four times a day (QID) | RESPIRATORY_TRACT | Status: DC

## 2019-12-10 MED ORDER — NITROFURANTOIN MONOHYD MACRO 100 MG PO CAPS: 100.0000 mg | ORAL_CAPSULE | Freq: Two times a day (BID) | ORAL | 0 refills | Status: AC

## 2019-12-10 NOTE — Discharge Summary (Signed)
Physician Discharge Summary   Mary Villegas  female DOB: 09/24/1966  ZOX:[id number]  PCP: Erasmo DownerBacigalupo, Angela M, MD  Admit date: 12/06/2019 Discharge date:  12/10/2019  Admitted From: home Disposition:  home CODE STATUS: Full code  Discharge Instructions    Diet - low sodium heart healthy   Complete by: As directed    Increase activity slowly   Complete by: As directed        Hospital Course:  For full details, please see H&P, progress notes, consult notes and ancillary notes.  Briefly,  Mary Literlizabeth E Williamsis a 53 y.o. Caucasian femalewithhistory of essential hypertension whotested positive forCOVID-19 on 12/21/20with symptoms since 11/26/19, presented complaining of N/V/D, progressive weakness, dyspnea and 'pain in her ovaries'.   Acute respiratory failure with hypoxia (HCC) secondary to Covid 19 pneumonia  Patient presented with O2 sat mid 80's on RA, and needed 2L O2.  CT abdomen and pelvis showed multifocal patchy/groundglass opacities throughout both lungs consistent with Covid pneumonia.  Pt received 4 days of Remdesivir and steroids.  Prior to discharge, pt was able to ambulate without need for suppl O2.  Mild elevation in Cr, not meeting AKI criteria On presentation, Creatinine 1.23,(most recent around 1.1), likely secondary to dehydration from nausea and vomiting. Patient got a small 500 mL bolus of normal saline in the emergency room.  CT abdomen and pelvis did not show any acute intra-abdominal or pelvic pathology.  On the day of discharge, Cr 0.99.  Acute gastroenteritis, resolved Most likely due toCOVID-19 infection.  CT abdomen and pelvis showed no acute pathology.  Pt received Antiemetics and a small 500 ml bolus on presentation.  Symptoms quickly resolved, and pt was able to have normal PO intake prior to discharge.  Essential hypertension Continued home metoprolol  UTI, POA Pt complained of "ovary pain" and dysuria (going on for several  weeks) that had resolved by Day 3 of hospitalization, likely due to UTI being treated by empiric ceftriaxone given on presentation.  UA on presentation was neg nitrite/neg leuk, however, urine cx grew E coli pan-sensitive.  Pt was started on macrobid to finish a course of treatment for UTI at home.   Discharge Diagnoses:  Principal Problem:   Acute respiratory failure with hypoxia (HCC) Active Problems:   Essential hypertension   Pneumonia due to COVID-19 virus   AKI (acute kidney injury) (HCC)   UTI (urinary tract infection)    Discharge Instructions:  Allergies as of 12/10/2019      Reactions   Atorvastatin    Other reaction(s): Muscle Pain   Lovastatin    myalgias   Pravastatin Sodium    myalgias   Simvastatin    Causes anxiety      Medication List    STOP taking these medications   mupirocin ointment 2 % Commonly known as: Bactroban     TAKE these medications   albuterol 108 (90 Base) MCG/ACT inhaler Commonly known as: VENTOLIN HFA Inhale 2 puffs into the lungs every 6 (six) hours.   cholecalciferol 1000 units tablet Commonly known as: VITAMIN D Take 1 tablet by mouth daily.   citalopram 40 MG tablet Commonly known as: CELEXA Take 1 tablet (40 mg total) by mouth daily.   hydrochlorothiazide 12.5 MG capsule Commonly known as: MICROZIDE Take 1 capsule (12.5 mg total) by mouth daily.   hydrOXYzine 50 MG capsule Commonly known as: VISTARIL One daily as needed for anxiety   MAGNESIUM PO Take 1 tablet by mouth daily.   metoprolol  succinate 50 MG 24 hr tablet Commonly known as: TOPROL-XL TAKE 1 TABLET BY MOUTH ONCE DAILY   nitrofurantoin (macrocrystal-monohydrate) 100 MG capsule Commonly known as: MACROBID Take 1 capsule (100 mg total) by mouth 2 (two) times daily for 7 days. Antibiotic for UTI.   ondansetron 4 MG disintegrating tablet Commonly known as: ZOFRAN-ODT Take 4 mg by mouth every 4 (four) hours as needed.       Follow-up Information      Bacigalupo, Marzella Schlein, MD In 1 week.   Specialty: Family Medicine Contact information: [address] [place] [address] [phone]           Allergies  Allergen Reactions  . Atorvastatin     Other reaction(s): Muscle Pain  . Lovastatin     myalgias  . Pravastatin Sodium     myalgias  . Simvastatin     Causes anxiety     The results of significant diagnostics from this hospitalization (including imaging, microbiology, ancillary and laboratory) are listed below for reference.   Consultations:   Procedures/Studies: DG Chest Port 1 View  Result Date: 12/06/2019 CLINICAL DATA:  Shortness of breath, fever EXAM: PORTABLE CHEST 1 VIEW COMPARISON:  None. FINDINGS: The heart size and mediastinal contours are within normal limits. Patchy/streaky airspace disease seen within the right lower lung and left lower lung. There is mildly increased interstitial markings seen throughout both lungs. No acute osseous abnormality. IMPRESSION: Airspace opacities seen within both lungs which could be due to viral infectious etiology and/or asymmetric edema is Electronically Signed   By: Jonna Clark M.D.   On: 12/06/2019 21:01   CT Renal Stone Study  Result Date: 12/06/2019 CLINICAL DATA:  Flank pain, COVID positive EXAM: CT ABDOMEN AND PELVIS WITHOUT CONTRAST TECHNIQUE: Multidetector CT imaging of the abdomen and pelvis was performed following the standard protocol without IV contrast. COMPARISON:  None FINDINGS: Lower chest: The visualized heart size within normal limits. No pericardial fluid/thickening. No hiatal hernia. Multifocal patchy/ground-glass opacity seen at both lung bases. Hepatobiliary: Although limited due to the lack of intravenous contrast, normal in appearance without gross focal abnormality. No evidence of calcified gallstones or biliary ductal dilatation. Pancreas:  Unremarkable.  No surrounding inflammatory changes. Spleen: Normal in size. Although limited due  to the lack of intravenous contrast, normal in appearance. Adrenals/Urinary Tract: Both adrenal glands appear normal. The kidneys and collecting system appear normal without evidence of urinary tract calculus or hydronephrosis. Bladder is unremarkable. Stomach/Bowel: The stomach, small bowel, and colon are normal in appearance. No inflammatory changes or obstructive findings. Vascular/Lymphatic: There are no enlarged abdominal or pelvic lymph nodes. Scattered aortic atherosclerotic calcifications are seen without aneurysmal dilatation. Reproductive: The uterus and adnexa are unremarkable. Other: No evidence of abdominal wall mass or hernia. Musculoskeletal: No acute or significant osseous findings. IMPRESSION: Multifocal patchy/ground-glass opacities throughout both lungs, consistent with COVID pneumonia. No acute intra-abdominal or pelvic pathology. Aortic Atherosclerosis (ICD10-I70.0). Electronically Signed   By: Jonna Clark M.D.   On: 12/06/2019 22:29      Labs: BNP (last 3 results) No results for input(s): BNP in the last 8760 hours. Basic Metabolic Panel: Recent Labs  Lab 12/06/19 2037 12/07/19 0459 12/08/19 0436 12/09/19 0517 12/10/19 0538  NA 136 140 139 137 139  K 3.8 3.9 4.3 3.9 4.3  CL 102 103 105 104 103  CO2 GLUCOSE 128* 158* 162* 161* 177*  BUN 21* 22*  CREATININE 1.23* 1.15*  1.14* 0.95 0.99  CALCIUM 7.8* 8.1* 8.2* 8.5* 8.8*  MG 1.8 1.9 2.4 2.2 2.2  PHOS  --  3.7 4.0 4.2 4.7*   Liver Function Tests: Recent Labs  Lab 12/06/19 2037 12/07/19 0459 12/08/19 0436 12/09/19 0517 12/10/19 0538  AST 28 31 25 27  36  ALT 26 24 20 20 30   ALKPHOS 59 60 51 50 56  BILITOT 0.6 0.4 0.5 0.6 0.5  PROT 6.7 7.0 6.6 6.7 7.3  ALBUMIN 3.3* 3.3* 2.9* 3.0* 3.1*   No results for input(s): LIPASE, AMYLASE in the last 168 hours. No results for input(s): AMMONIA in the last 168 hours. CBC: Recent Labs  Lab 12/06/19 2037 12/07/19 0110 12/07/19 0459  12/08/19 0436 12/09/19 0517 12/10/19 0538  WBC 4.9 6.4 5.6 6.8 5.5 6.8  NEUTROABS 3.7  --  4.7 5.7 4.1 4.6  HGB 12.9 12.4 13.4 12.3 12.7 13.8  HCT 36.9 36.1 39.4 35.8* 37.9 40.0  MCV 79.0* 79.3* 79.8* 79.4* 82.6 79.1*  PLT 195 186 196 220 272 369   Cardiac Enzymes: No results for input(s): CKTOTAL, CKMB, CKMBINDEX, TROPONINI in the last 168 hours. BNP: Invalid input(s): POCBNP CBG: No results for input(s): GLUCAP in the last 168 hours. D-Dimer No results for input(s): DDIMER in the last 72 hours. Hgb A1c No results for input(s): HGBA1C in the last 72 hours. Lipid Profile No results for input(s): CHOL, HDL, LDLCALC, TRIG, CHOLHDL, LDLDIRECT in the last 72 hours. Thyroid function studies No results for input(s): TSH, T4TOTAL, T3FREE, THYROIDAB in the last 72 hours.  Invalid input(s): FREET3 Anemia work up Recent Labs    12/10/19 0538  FERRITIN 100   Urinalysis    Component Value Date/Time   COLORURINE YELLOW (A) 12/06/2019 2107   APPEARANCEUR HAZY (A) 12/06/2019 2107   LABSPEC 1.013 12/06/2019 2107   PHURINE 7.0 12/06/2019 2107   GLUCOSEU NEGATIVE 12/06/2019 2107   HGBUR LARGE (A) 12/06/2019 2107   BILIRUBINUR NEGATIVE 12/06/2019 2107   KETONESUR NEGATIVE 12/06/2019 2107   PROTEINUR 100 (A) 12/06/2019 2107   NITRITE NEGATIVE 12/06/2019 2107   LEUKOCYTESUR NEGATIVE 12/06/2019 2107   Sepsis Labs Invalid input(s): PROCALCITONIN,  WBC,  LACTICIDVEN Microbiology Recent Results (from the past 240 hour(s))  Blood Culture (routine x 2)     Status: None   Collection Time: 12/06/19  8:43 PM   Specimen: BLOOD  Result Value Ref Range Status   Specimen Description BLOOD FATTY CASTS  Final   Special Requests   Final    BOTTLES DRAWN AEROBIC AND ANAEROBIC Blood Culture adequate volume   Culture   Final    NO GROWTH 5 DAYS Performed at [facility], [address], [place], [address] [place]    Report Status 12/11/2019 FINAL  Final  Blood Culture (routine x 2)      Status: None   Collection Time: 12/06/19  8:48 PM   Specimen: BLOOD LEFT HAND  Result Value Ref Range Status   Specimen Description BLOOD LEFT HAND  Final   Special Requests Blood Culture adequate volume  Final   Culture   Final    NO GROWTH 5 DAYS Performed at [facility], [address], [place], [address] [place]    Report Status 12/11/2019 FINAL  Final  Urine culture     Status: Abnormal   Collection Time: 12/06/19  9:07 PM   Specimen: Urine, Clean Catch  Result Value Ref Range Status   Specimen Description   Final    URINE, CLEAN  CATCH Performed at [facility], [address] [place], [place] [address]    Special Requests   Final    NONE Performed at [facility], [address] [place], [place] [address]    Culture >=100,000 COLONIES/mL ESCHERICHIA COLI (A)  Final   Report Status 12/09/2019 FINAL  Final   Organism ID, Bacteria ESCHERICHIA COLI (A)  Final      Susceptibility   Escherichia coli - MIC*    AMPICILLIN 8 SENSITIVE Sensitive     CEFAZOLIN <=4 SENSITIVE Sensitive     CEFTRIAXONE <=1 SENSITIVE Sensitive     CIPROFLOXACIN <=0.25 SENSITIVE Sensitive     GENTAMICIN <=1 SENSITIVE Sensitive     IMIPENEM <=0.25 SENSITIVE Sensitive     NITROFURANTOIN <=16 SENSITIVE Sensitive     TRIMETH/SULFA <=20 SENSITIVE Sensitive     AMPICILLIN/SULBACTAM 4 SENSITIVE Sensitive     PIP/TAZO <=4 SENSITIVE Sensitive     * >=100,000 COLONIES/mL ESCHERICHIA COLI  Respiratory Panel by RT PCR (Flu A&B, Covid) - Nasopharyngeal Swab     Status: Abnormal   Collection Time: 12/06/19 11:03 PM   Specimen: Nasopharyngeal Swab  Result Value Ref Range Status   SARS Coronavirus 2 by RT PCR POSITIVE (A) NEGATIVE Final    Comment: RESULT CALLED TO, READ BACK BY AND VERIFIED WITH: PAIGE JOHNSON 12/06/19 @ 2358  MLK (NOTE) SARS-CoV-2 target nucleic acids are DETECTED. SARS-CoV-2 RNA is generally detectable in upper respiratory specimens  during the acute  phase of infection. Positive results are indicative of the presence of the identified virus, but do not rule out bacterial infection or co-infection with other pathogens not detected by the test. Clinical correlation with patient history and other diagnostic information is necessary to determine patient infection status. The expected result is Negative. Fact Sheet for Patients:  https://www.moore.com/ Fact Sheet for Healthcare Providers: https://www.young.biz/ This test is not yet approved or cleared by the [place] FDA and  has been authorized for detection and/or diagnosis of SARS-CoV-2 by FDA under an Emergency Use Authorization (EUA).  This EUA will remain in effect (meaning this test can be used) fo r the duration of  the COVID-19 declaration under Section 564(b)(1) of the Act, 21 U.S.C. section 360bbb-3(b)(1), unless the authorization is terminated or revoked sooner.    Influenza A by PCR NEGATIVE NEGATIVE Final   Influenza B by PCR NEGATIVE NEGATIVE Final    Comment: (NOTE) The Xpert Xpress SARS-CoV-2/FLU/RSV assay is intended as an aid in  the diagnosis of influenza from Nasopharyngeal swab specimens and  should not be used as a sole basis for treatment. Nasal washings and  aspirates are unacceptable for Xpert Xpress SARS-CoV-2/FLU/RSV  testing. Fact Sheet for Patients: https://www.moore.com/ Fact Sheet for Healthcare Providers: https://www.young.biz/ This test is not yet approved or cleared by the [place] FDA and  has been authorized for detection and/or diagnosis of SARS-CoV-2 by  FDA under an Emergency Use Authorization (EUA). This EUA will remain  in effect (meaning this test can be used) for the duration of the  Covid-19 declaration under Section 564(b)(1) of the Act, 21  U.S.C. section 360bbb-3(b)(1), unless the authorization is  terminated or revoked. Performed at [facility], [address] [place], [place] [address]      Total time spend on discharging this patient, including the last patient exam, discussing the hospital stay, instructions for ongoing care as it relates to all pertinent caregivers, as well as preparing the medical discharge records, prescriptions, and/or referrals  as applicable, is 30 minutes.    Enzo Bi, MD  [facility] 12/13/2019, 2:20 AM  If 7PM-7AM, please contact night-coverage

## 2019-12-10 NOTE — Progress Notes (Signed)
Discharge order received. Patient mental status is at baseline. Vital signs stable . No signs of acute distress. Discharge instructions given. Patient verbalized understanding. No other issues noted at this time.   

## 2019-12-10 NOTE — Progress Notes (Signed)
As of 10:30 pt ambulated in the room and O2 stayed on 92-93 % on RA.

## 2019-12-11 ENCOUNTER — Telehealth: Payer: Self-pay

## 2019-12-11 LAB — CULTURE, BLOOD (ROUTINE X 2)
Culture: NO GROWTH
Culture: NO GROWTH
Special Requests: ADEQUATE
Special Requests: ADEQUATE

## 2019-12-11 NOTE — Telephone Encounter (Signed)
Transition Care Management Follow-up Telephone Call  Date of discharge and from where: [facility] on 12/10/19  How have you been since you were released from the hospital? Doing ok, does not feel any better or worse. Was not able to eat this morning due to not feeling well. Slept good last night. Still coughing and congested. Sputum is clear. Is SOB with walking a long ways but it is getting better. Declines fever, pain, urinary s/s or n/v/d. Is drinking lots of fluids.   Any questions or concerns? No   Items Reviewed:  Did the pt receive and understand the discharge instructions provided? Yes   Medications obtained and verified? Yes   Any new allergies since your discharge? No   Dietary orders reviewed? Yes  Do you have support at home? Yes   Other (ie: DME, Home Health, etc) N/A  Functional Questionnaire: (I = Independent and D = Dependent)  Bathing/Dressing- I   Meal Prep- I  Eating- I  Maintaining continence- I  Transferring/Ambulation- I  Managing Meds- I   Follow up appointments reviewed:    PCP Hospital f/u appt confirmed? No, no available opening with PCP. Pt was Covid positive. Message sent to inquire about a f/u.  [facility] f/u appt confirmed? N/A  Are transportation arrangements needed? No   If their condition worsens, is the pt aware to call  their PCP or go to the ED? Yes  Was the patient provided with contact information for the PCP's office or ED? Yes  Was the pt encouraged to call back with questions or concerns? Yes

## 2019-12-11 NOTE — Telephone Encounter (Signed)
See McKenzie's note that her appt on 12/17/19 needs to be canceled and she needs new hospital f/u for at least 21 days out from her positive covid 19 test. I cannot see her test. Hospital note says positive on 12/03/19, so the earliest she could be seen in our office is on 12/25/19

## 2019-12-11 NOTE — Telephone Encounter (Signed)
No HFU scheduled at this point.

## 2019-12-12 NOTE — Telephone Encounter (Signed)
Appointment rescheduled to 12/27/2019 at 11am. Patient notified.

## 2019-12-17 ENCOUNTER — Inpatient Hospital Stay: Payer: BC Managed Care – PPO | Admitting: Physician Assistant

## 2019-12-24 NOTE — Progress Notes (Signed)
Patient: Mary Villegas Female    DOB: 01-18-1966   54 y.o.   MRN: [id number] Visit Date: 12/27/2019  Today's Provider: Mar Daring, PA-C   Chief Complaint  Patient presents with  . Follow-up   Subjective:     HPI   Follow up Hospitalization  Patient was admitted to [facility] on 12/06/2019 and discharged on 12/10/2019. She was treated for Acute respiratory failure with hypoxia secondary to COVID 19 pneumonia Treatment for this included 2L O2, received 4 days of Remdesivir and steroids, Antiemetics and small 500 mL bolus of normal saline in the emergency room,empiric ceftriaxone,  started on macrobid to finish a course of treatment for UTI at home.  Telephone follow up was done on 12/11/2019. She reports good compliance with treatment. She reports this condition is Improved. -----------------------------------------------------------------------------------     Allergies  Allergen Reactions  . Atorvastatin     Other reaction(s): Muscle Pain  . Lovastatin     myalgias  . Pravastatin Sodium     myalgias  . Simvastatin     Causes anxiety     Current Outpatient Medications:  .  albuterol (VENTOLIN HFA) 108 (90 Base) MCG/ACT inhaler, Inhale 2 puffs into the lungs every 6 (six) hours., Disp:  , Rfl:  .  cholecalciferol (VITAMIN D) 1000 UNITS tablet, Take 1 tablet by mouth daily., Disp: , Rfl:  .  citalopram (CELEXA) 40 MG tablet, Take 1 tablet (40 mg total) by mouth daily., Disp: 90 tablet, Rfl: 1 .  hydrochlorothiazide (MICROZIDE) 12.5 MG capsule, Take 1 capsule (12.5 mg total) by mouth daily. (Patient not taking: Reported on 12/06/2019), Disp: 90 capsule, Rfl: 3 .  hydrOXYzine (VISTARIL) 50 MG capsule, One daily as needed for anxiety (Patient not taking: Reported on 12/11/2019), Disp: 30 capsule, Rfl: 1 .  MAGNESIUM PO, Take 1 tablet by mouth daily., Disp: , Rfl:  .  metoprolol succinate (TOPROL-XL) 50 MG 24 hr tablet, TAKE 1 TABLET BY MOUTH ONCE DAILY,  Disp: 90 tablet, Rfl: 3 .  ondansetron (ZOFRAN-ODT) 4 MG disintegrating tablet, Take 4 mg by mouth every 4 (four) hours as needed., Disp: , Rfl:   Review of Systems  Constitutional: Positive for fatigue. Negative for appetite change, chills and fever.  HENT: Positive for congestion, rhinorrhea and sinus pain (sore in left nare). Negative for ear pain, hearing loss, sore throat and trouble swallowing.   Eyes: Negative.   Respiratory: Positive for cough and shortness of breath. Negative for apnea, chest tightness and wheezing.   Cardiovascular: Negative.   Gastrointestinal: Positive for nausea. Negative for abdominal pain and diarrhea.  Endocrine: Negative.   Genitourinary: Negative.   Musculoskeletal: Negative.   Skin: Positive for rash.  Allergic/Immunologic: Negative.   Neurological: Negative.   Hematological: Negative.   Psychiatric/Behavioral: Negative.     Social History   Tobacco Use  . Smoking status: Former Smoker    Packs/day: 0.50    Years: 20.00    Pack years: 10.00    Types: Cigarettes  . Smokeless tobacco: Never Used  . Tobacco comment: QUIT IN 2005  Substance Use Topics  . Alcohol use: No    Alcohol/week: 0.0 standard drinks      Objective:   BP 128/76   Pulse 95   Temp (!) 97.5 F (36.4 C) (Temporal)   Resp 20   Ht 5\' 5"  (1.651 m)   Wt 239 lb (108.4 kg)   LMP  (LMP Unknown)   SpO2 95%  BMI 39.77 kg/m  Vitals:   12/27/19 1106  BP: 128/76  Pulse: 95  Resp: 20  Temp: (!) 97.5 F (36.4 C)  TempSrc: Temporal  SpO2: 95%  Weight: 239 lb (108.4 kg)  Height: 5\' 5"  (1.651 m)  Body mass index is 39.77 kg/m.   Physical Exam Vitals reviewed.  Constitutional:      General: She is not in acute distress.    Appearance: Normal appearance. She is well-developed. She is obese. She is not ill-appearing or diaphoretic.  HENT:     Head: Normocephalic and atraumatic.     Right Ear: Hearing, tympanic membrane, ear canal and external ear normal.     Left  Ear: Hearing, tympanic membrane, ear canal and external ear normal.     Nose: Laceration (left nare) present.     Mouth/Throat:     Mouth: Mucous membranes are moist.     Pharynx: Oropharynx is clear. Uvula midline. No oropharyngeal exudate or posterior oropharyngeal erythema.  Eyes:     General: No scleral icterus.       Right eye: No discharge.        Left eye: No discharge.     Extraocular Movements: Extraocular movements intact.     Conjunctiva/sclera: Conjunctivae normal.     Pupils: Pupils are equal, round, and reactive to light.  Neck:     Thyroid: No thyromegaly.     Trachea: No tracheal deviation.  Cardiovascular:     Rate and Rhythm: Normal rate and regular rhythm.     Pulses: Normal pulses.     Heart sounds: Normal heart sounds. No murmur. No friction rub. No gallop.   Pulmonary:     Effort: Pulmonary effort is normal. No respiratory distress.     Breath sounds: Normal breath sounds. No stridor. No wheezing or rales.  Musculoskeletal:     Cervical back: Normal range of motion and neck supple.  Lymphadenopathy:     Cervical: No cervical adenopathy.     Upper Body:     Right upper body: Axillary adenopathy present.     Left upper body: Axillary adenopathy present.  Skin:    General: Skin is warm and dry.  Neurological:     Mental Status: She is alert.      No results found for any visits on 12/27/19.     Assessment & Plan    1. Lymphadenopathy, axillary Bilateral axillary adenopathy present. May use warm compresses and tylenol for discomfort. Suspect secondary to multiple infections (UTI and covid pneumonia). Will check labs as below and f/u pending results. Would expect lymph nodes to decrease in size over the next 2 weeks. Call if not improving and will consider 12/29/19.  - CBC w/Diff/Platelet - Comprehensive Metabolic Panel (CMET) - C-reactive protein  2. History of COVID-19 Improving. Still has some SOB with light activity. Will try to work full shift on  Sunday, but advised to call if not able and will give restricted work note to work 4 hours per day x 1 week.  - CBC w/Diff/Platelet - Comprehensive Metabolic Panel (CMET) - C-reactive protein  3. Nasal sore New over last 3 days. Will treat with bactroban as below.  - mupirocin ointment (BACTROBAN) 2 %; Place 1 application into the nose 2 (two) times daily.  Dispense: 22 g; Refill: 0  4. History of pneumonia Repeat CXR for clearance of pneumonia.  - DG Chest 2 View; Future     Tuesday, PA-C  [facility]

## 2019-12-27 ENCOUNTER — Ambulatory Visit
Admission: RE | Admit: 2019-12-27 | Discharge: 2019-12-27 | Disposition: A | Discharge status: Home / Self Care | Payer: BC Managed Care – PPO | Attending: Physician Assistant | Admitting: Physician Assistant

## 2019-12-27 ENCOUNTER — Other Ambulatory Visit: Payer: Self-pay

## 2019-12-27 ENCOUNTER — Ambulatory Visit
Admission: RE | Admit: 2019-12-27 | Discharge: 2019-12-27 | Disposition: A | Discharge status: Home / Self Care | Payer: BC Managed Care – PPO | Source: Ambulatory Visit | Attending: Physician Assistant | Admitting: Physician Assistant

## 2019-12-27 ENCOUNTER — Ambulatory Visit (INDEPENDENT_AMBULATORY_CARE_PROVIDER_SITE_OTHER): Payer: BC Managed Care – PPO | Admitting: Physician Assistant

## 2019-12-27 ENCOUNTER — Encounter: Payer: Self-pay | Admitting: Physician Assistant

## 2019-12-27 ENCOUNTER — Telehealth: Payer: Self-pay

## 2019-12-27 VITALS — BP 128/76 | HR 95 | Temp 97.5°F | Resp 20 | Ht 65.0 in | Wt 239.0 lb

## 2019-12-27 DIAGNOSIS — Z8701 Personal history of pneumonia (recurrent): Secondary | ICD-10-CM | POA: Insufficient documentation

## 2019-12-27 DIAGNOSIS — R590 Localized enlarged lymph nodes: Secondary | ICD-10-CM

## 2019-12-27 DIAGNOSIS — R0602 Shortness of breath: Secondary | ICD-10-CM | POA: Diagnosis not present

## 2019-12-27 DIAGNOSIS — Z8616 Personal history of COVID-19: Secondary | ICD-10-CM | POA: Diagnosis not present

## 2019-12-27 DIAGNOSIS — J3489 Other specified disorders of nose and nasal sinuses: Secondary | ICD-10-CM | POA: Diagnosis not present

## 2019-12-27 MED ORDER — MUPIROCIN 2 % EX OINT: 1.0000 "application " | TOPICAL_OINTMENT | Freq: Two times a day (BID) | CUTANEOUS | 0 refills | Status: DC

## 2019-12-27 NOTE — Telephone Encounter (Signed)
-----   Message from Margaretann Loveless, [place] sent at 12/27/2019  2:17 PM EST ----- CXR is improving and almost clear. Right lung has improved completely. Left lung still has minimal remaining area noted in the base.

## 2019-12-27 NOTE — Telephone Encounter (Signed)
Pt advised.   Thanks,   -Cincere Zorn  

## 2019-12-28 ENCOUNTER — Telehealth: Payer: Self-pay

## 2019-12-28 LAB — CBC WITH DIFFERENTIAL/PLATELET
Basophils Absolute: 0 10*3/uL (ref 0.0–0.2)
Basos: 0 %
EOS (ABSOLUTE): 0.3 10*3/uL (ref 0.0–0.4)
Eos: 4 %
Hematocrit: 37.2 % (ref 34.0–46.6)
Hemoglobin: 12.7 g/dL (ref 11.1–15.9)
Immature Grans (Abs): 0 10*3/uL (ref 0.0–0.1)
Immature Granulocytes: 0 %
Lymphocytes Absolute: 2.2 10*3/uL (ref 0.7–3.1)
Lymphs: 29 %
MCH: 28.2 pg (ref 26.6–33.0)
MCHC: 34.1 g/dL (ref 31.5–35.7)
MCV: 83 fL (ref 79–97)
Monocytes Absolute: 0.7 10*3/uL (ref 0.1–0.9)
Monocytes: 9 %
Neutrophils Absolute: 4.4 10*3/uL (ref 1.4–7.0)
Neutrophils: 58 %
Platelets: 294 10*3/uL (ref 150–450)
RBC: 4.51 x10E6/uL (ref 3.77–5.28)
RDW: 13.7 % (ref 11.7–15.4)
WBC: 7.6 10*3/uL (ref 3.4–10.8)

## 2019-12-28 LAB — COMPREHENSIVE METABOLIC PANEL
ALT: 19 IU/L (ref 0–32)
AST: 18 IU/L (ref 0–40)
Albumin/Globulin Ratio: 1.3 (ref 1.2–2.2)
Albumin: 3.7 g/dL — ABNORMAL LOW (ref 3.8–4.9)
Alkaline Phosphatase: 84 IU/L (ref 39–117)
BUN/Creatinine Ratio: 11 (ref 9–23)
BUN: 12 mg/dL (ref 6–24)
Bilirubin Total: 0.3 mg/dL (ref 0.0–1.2)
CO2: 24 mmol/L (ref 20–29)
Calcium: 9.2 mg/dL (ref 8.7–10.2)
Chloride: 101 mmol/L (ref 96–106)
Creatinine, Ser: 1.1 mg/dL — ABNORMAL HIGH (ref 0.57–1.00)
GFR calc Af Amer: 65 mL/min/{1.73_m2} (ref >59)
GFR calc non Af Amer: 57 mL/min/{1.73_m2} — ABNORMAL LOW (ref >59)
Globulin, Total: 2.9 g/dL (ref 1.5–4.5)
Glucose: 96 mg/dL (ref 65–99)
Potassium: 4 mmol/L (ref 3.5–5.2)
Sodium: 141 mmol/L (ref 134–144)
Total Protein: 6.6 g/dL (ref 6.0–8.5)

## 2019-12-28 LAB — C-REACTIVE PROTEIN: CRP: 3 mg/L (ref 0–10)

## 2019-12-28 NOTE — Telephone Encounter (Signed)
Called and spoke with the patient and informed her of her lab results. She gave verbal understanding.

## 2020-04-17 ENCOUNTER — Other Ambulatory Visit: Payer: Self-pay | Admitting: Family Medicine

## 2020-07-17 ENCOUNTER — Other Ambulatory Visit: Payer: Self-pay | Admitting: Family Medicine

## 2020-07-17 NOTE — Telephone Encounter (Signed)
Called to make appt and patient will have to call back . 30 day courtesy refill provided.

## 2020-09-22 ENCOUNTER — Other Ambulatory Visit: Payer: Self-pay

## 2020-09-22 ENCOUNTER — Encounter: Payer: Self-pay | Admitting: Physician Assistant

## 2020-09-22 ENCOUNTER — Ambulatory Visit: Payer: Self-pay

## 2020-09-22 ENCOUNTER — Telehealth (INDEPENDENT_AMBULATORY_CARE_PROVIDER_SITE_OTHER): Payer: BC Managed Care – PPO | Admitting: Physician Assistant

## 2020-09-22 DIAGNOSIS — J189 Pneumonia, unspecified organism: Secondary | ICD-10-CM

## 2020-09-22 DIAGNOSIS — R0602 Shortness of breath: Secondary | ICD-10-CM

## 2020-09-22 DIAGNOSIS — R059 Cough, unspecified: Secondary | ICD-10-CM | POA: Diagnosis not present

## 2020-09-22 DIAGNOSIS — B349 Viral infection, unspecified: Secondary | ICD-10-CM | POA: Diagnosis not present

## 2020-09-22 DIAGNOSIS — R509 Fever, unspecified: Secondary | ICD-10-CM

## 2020-09-22 DIAGNOSIS — J0140 Acute pansinusitis, unspecified: Secondary | ICD-10-CM | POA: Diagnosis not present

## 2020-09-22 MED ORDER — ALBUTEROL SULFATE HFA 108 (90 BASE) MCG/ACT IN AERS: 2.0000 | INHALATION_SPRAY | Freq: Four times a day (QID) | RESPIRATORY_TRACT | Status: AC

## 2020-09-22 MED ORDER — DOXYCYCLINE HYCLATE 100 MG PO TABS: 100.0000 mg | ORAL_TABLET | Freq: Two times a day (BID) | ORAL | 0 refills | Status: DC

## 2020-09-22 MED ORDER — PREDNISONE 20 MG PO TABS: 20.0000 mg | ORAL_TABLET | Freq: Every day | ORAL | 0 refills | Status: DC

## 2020-09-22 MED ORDER — CITALOPRAM HYDROBROMIDE 40 MG PO TABS: 40.0000 mg | ORAL_TABLET | Freq: Every day | ORAL | 1 refills | Status: DC

## 2020-09-22 NOTE — Progress Notes (Signed)
MyChart Video Visit    Virtual Visit via Video Note   This visit type was conducted due to national recommendations for restrictions regarding the COVID-19 Pandemic (e.g. social distancing) in an effort to limit this patient's exposure and mitigate transmission in our community. This patient is at least at moderate risk for complications without adequate follow up. This format is felt to be most appropriate for this patient at this time. Physical exam was limited by quality of the video and audio technology used for the visit.   Patient location: Home Provider location: [facility]  I discussed the limitations of evaluation and management by telemedicine and the availability of in person appointments. The patient expressed understanding and agreed to proceed.  Patient: Mary Villegas   DOB: October 25, 1966   54 y.o. Female  MRN: [id number] Visit Date: 09/22/2020  Today's healthcare provider: Margaretann Loveless, PA-C   Chief Complaint  Patient presents with  . URI   Subjective    URI  This is a new problem. The current episode started yesterday. Associated symptoms include congestion, coughing, headaches and a sore throat. Pertinent negatives include no diarrhea, vomiting or wheezing. Associated symptoms comments: Chills. She has tried inhaler use (albuterol) for the symptoms. The treatment provided no relief.    Patient has had covid 19 last year.  Patient Active Problem List   Diagnosis Date Noted  . Acute respiratory failure with hypoxia (HCC) 12/06/2019  . Pneumonia due to COVID-19 virus 12/06/2019  . AKI (acute kidney injury) (HCC) 12/06/2019  . UTI (urinary tract infection) 12/06/2019  . Moderate episode of recurrent major depressive disorder (HCC) 08/31/2019  . GAD (generalized anxiety disorder) 08/31/2019  . Morbid obesity (HCC) 08/31/2019  . Absence of menstruation 06/18/2015  . Pain in shoulder 06/18/2015  . Dermatitis, eczematoid 06/18/2015  . Essential hypertension  06/18/2015  . LBP (low back pain) 06/18/2015  . Adult BMI 30+ 06/18/2015  . Cervical pain 06/18/2015  . Avitaminosis D 06/18/2015  . Hypercholesterolemia without hypertriglyceridemia 10/13/2007  . Acute anxiety 07/07/2007   Past Medical History:  Diagnosis Date  . Anxiety   . Depression   . Hypertension       Medications: Outpatient Medications Prior to Visit  Medication Sig  . albuterol (VENTOLIN HFA) 108 (90 Base) MCG/ACT inhaler Inhale 2 puffs into the lungs every 6 (six) hours.  . cholecalciferol (VITAMIN D) 1000 UNITS tablet Take 1 tablet by mouth daily.  . hydrOXYzine (VISTARIL) 50 MG capsule One daily as needed for anxiety  . MAGNESIUM PO Take 1 tablet by mouth daily.  . metoprolol succinate (TOPROL-XL) 50 MG 24 hr tablet TAKE 1 TABLET BY MOUTH ONCE DAILY  . citalopram (CELEXA) 40 MG tablet Take 1 tablet by mouth once daily (Patient not taking: Reported on 09/22/2020)  . hydrochlorothiazide (MICROZIDE) 12.5 MG capsule Take 1 capsule (12.5 mg total) by mouth daily. (Patient not taking: Reported on 12/06/2019)  . mupirocin ointment (BACTROBAN) 2 % Place 1 application into the nose 2 (two) times daily.  . ondansetron (ZOFRAN-ODT) 4 MG disintegrating tablet Take 4 mg by mouth every 4 (four) hours as needed.   No facility-administered medications prior to visit.    Review of Systems  Constitutional: Positive for chills and fever.  HENT: Positive for congestion and sore throat.   Respiratory: Positive for cough, chest tightness and shortness of breath. Negative for wheezing.   Gastrointestinal: Negative for diarrhea and vomiting.  Neurological: Positive for headaches.    Last CBC Lab  Results  Component Value Date   WBC 7.6 12/27/2019   HGB 12.7 12/27/2019   HCT 37.2 12/27/2019   MCV 83 12/27/2019   MCH 28.2 12/27/2019   RDW 13.7 12/27/2019   PLT 294 12/27/2019   Last metabolic panel Lab Results  Component Value Date   GLUCOSE 96 12/27/2019   NA 141 12/27/2019     K 4.0 12/27/2019   CL 101 12/27/2019   CO2 24 12/27/2019   BUN 12 12/27/2019   CREATININE 1.10 (H) 12/27/2019   GFRNONAA 57 (L) 12/27/2019   GFRAA 65 12/27/2019   CALCIUM 9.2 12/27/2019   PHOS 4.7 (H) 12/10/2019   PROT 6.6 12/27/2019   ALBUMIN 3.7 (L) 12/27/2019   LABGLOB 2.9 12/27/2019   AGRATIO 1.3 12/27/2019   BILITOT 0.3 12/27/2019   ALKPHOS 84 12/27/2019   AST 18 12/27/2019   ALT 19 12/27/2019   ANIONGAP 12 12/10/2019      Objective    LMP  (LMP Unknown)  BP Readings from Last 3 Encounters:  12/27/19 128/76  12/10/19 (!) 152/91  10/18/19 132/84   Wt Readings from Last 3 Encounters:  12/27/19 239 lb (108.4 kg)  12/06/19 240 lb (108.9 kg)  10/18/19 245 lb (111.1 kg)      Physical Exam Vitals reviewed.  Constitutional:      Appearance: She is well-developed.  HENT:     Head: Normocephalic and atraumatic.  Pulmonary:     Effort: Pulmonary effort is normal. No respiratory distress.  Musculoskeletal:     Cervical back: Normal range of motion and neck supple.  Neurological:     Mental Status: She is alert.  Psychiatric:        Behavior: Behavior normal.        Thought Content: Thought content normal.        Judgment: Judgment normal.       Assessment & Plan     1. Fever, unspecified fever cause Covid test ordered but lower suspicion as patient has previously had covid 19 last year. Will give albuterol refill for cough and SOB. Will treat as possible sinus infection and/or pneumonia. Will give prednisone and doxycycline for these. Call if worsening. Warning signs of worsening breathing and SOB discussed as reasons for ER. She voiced understanding. - Novel Coronavirus, NAA ([facility]) - albuterol (VENTOLIN HFA) 108 (90 Base) MCG/ACT inhaler; Inhale 2 puffs into the lungs every 6 (six) hours.  2. Cough See above medical treatment plan. - Novel Coronavirus, NAA ([facility]) - albuterol (VENTOLIN HFA) 108 (90 Base) MCG/ACT inhaler; Inhale 2 puffs into the lungs  every 6 (six) hours.  3. SOB (shortness of breath) See above medical treatment plan. - Novel Coronavirus, NAA ([facility]) - albuterol (VENTOLIN HFA) 108 (90 Base) MCG/ACT inhaler; Inhale 2 puffs into the lungs every 6 (six) hours.  4. Acute non-recurrent pansinusitis See above medical treatment plan.  5. Community acquired pneumonia, unspecified laterality See above medical treatment plan. - predniSONE (DELTASONE) 20 MG tablet; Take 1 tablet (20 mg total) by mouth daily with breakfast.  Dispense: 5 tablet; Refill: 0 - doxycycline (VIBRA-TABS) 100 MG tablet; Take 1 tablet (100 mg total) by mouth 2 (two) times daily.  Dispense: 20 tablet; Refill: 0   No follow-ups on file.     I discussed the assessment and treatment plan with the patient. The patient was provided an opportunity to ask questions and all were answered. The patient agreed with the plan and demonstrated an understanding of the instructions.  The patient was advised to call back or seek an in-person evaluation if the symptoms worsen or if the condition fails to improve as anticipated.  I provided 11 minutes of face-to-face time during this encounter via MyChart Video enabled encounter.  Delmer Islam, PA-C, have reviewed all documentation for this visit. The documentation on 09/23/20 for the exam, diagnosis, procedures, and orders are all accurate and complete.  Reine Just [facility] [phone] (phone) [phone] (fax)  [facility]

## 2020-09-22 NOTE — Telephone Encounter (Signed)
Pt. Reports she started feeling bad yesterday.Headache, sore throat, cough, fever, chills, shortness of breath. Virtual visit made for today.Spoke with [facility] in the practice and they will call pt. Back.  Reason for Disposition . [1] Fever > 100.0 F (37.8 C) AND [2] bedridden (e.g., nursing home patient, CVA, chronic illness, recovering from surgery)  Answer Assessment - Initial Assessment Questions 1. COVID-19 DIAGNOSIS: "Who made your Coronavirus (COVID-19) diagnosis?" "Was it confirmed by a positive lab test?" If not diagnosed by a HCP, ask "Are there lots of cases (community spread) where you live?" (See public health department website, if unsure)     No test yet 2. COVID-19 EXPOSURE: "Was there any known exposure to COVID before the symptoms began?" CDC Definition of close contact: within 6 feet (2 meters) for a total of 15 minutes or more over a 24-hour period.      No 3. ONSET: "When did the COVID-19 symptoms start?"      Yesterday 4. WORST SYMPTOM: "What is your worst symptom?" (e.g., cough, fever, shortness of breath, muscle aches)     Shortness of breath,fever, chills,headache 5. COUGH: "Do you have a cough?" If Yes, ask: "How bad is the cough?"       Yes 6. FEVER: "Do you have a fever?" If Yes, ask: "What is your temperature, how was it measured, and when did it start?"     Feels hot 7. RESPIRATORY STATUS: "Describe your breathing?" (e.g., shortness of breath, wheezing, unable to speak)      Shortness of breath 8. BETTER-SAME-WORSE: "Are you getting better, staying the same or getting worse compared to yesterday?"  If getting worse, ask, "In what way?"     Same 9. HIGH RISK DISEASE: "Do you have any chronic medical problems?" (e.g., asthma, heart or lung disease, weak immune system, obesity, etc.)     No 10. PREGNANCY: "Is there any chance you are pregnant?" "When was your last menstrual period?"       No 11. OTHER SYMPTOMS: "Do you have any other symptoms?"  (e.g., chills,  fatigue, headache, loss of smell or taste, muscle pain, sore throat; new loss of smell or taste especially support the diagnosis of COVID-19)       Headache  Protocols used: CORONAVIRUS (COVID-19) DIAGNOSED OR SUSPECTED-A-AH

## 2020-09-22 NOTE — Telephone Encounter (Signed)
FYI. Looks like she is seeing you for a virtual visit today.

## 2020-09-23 LAB — NOVEL CORONAVIRUS, NAA: SARS-CoV-2, NAA: NOT DETECTED

## 2020-09-23 LAB — SARS-COV-2, NAA 2 DAY TAT

## 2020-09-23 LAB — SPECIMEN STATUS REPORT

## 2020-10-22 ENCOUNTER — Other Ambulatory Visit: Payer: Self-pay | Admitting: Family Medicine

## 2020-10-22 DIAGNOSIS — I10 Essential (primary) hypertension: Secondary | ICD-10-CM

## 2020-11-07 ENCOUNTER — Other Ambulatory Visit: Payer: Self-pay | Admitting: Family Medicine

## 2020-11-07 MED ORDER — CITALOPRAM HYDROBROMIDE 40 MG PO TABS
40.0000 mg | ORAL_TABLET | Freq: Every day | ORAL | 0 refills | Status: DC
Start: 2020-11-07 — End: 2021-02-20

## 2020-11-07 NOTE — Telephone Encounter (Signed)
Medication: citalopram (CELEXA) 40 MG tablet [[id number]]- medication fell into dog's bowl  Has the patient contacted their pharmacy? YES  (Agent: If no, request that the patient contact the pharmacy for the refill.) (Agent: If yes, when and what did the pharmacy advise?)  Preferred Pharmacy (with phone number or street name): [facility] [address] [place] - [address] [place] [address] Phone: [phone] Fax: [phone] Hours: Not open 24 hours    Agent: Please be advised that RX refills may take up to 3 business days. We ask that you follow-up with your pharmacy.

## 2021-02-20 ENCOUNTER — Other Ambulatory Visit: Payer: Self-pay

## 2021-02-20 ENCOUNTER — Encounter: Payer: Self-pay | Admitting: Family Medicine

## 2021-02-20 ENCOUNTER — Ambulatory Visit (INDEPENDENT_AMBULATORY_CARE_PROVIDER_SITE_OTHER): Payer: BC Managed Care – PPO | Admitting: Family Medicine

## 2021-02-20 VITALS — BP 167/96 | HR 87 | Temp 98.1°F | Resp 18 | Wt 247.0 lb

## 2021-02-20 DIAGNOSIS — E7800 Pure hypercholesterolemia, unspecified: Secondary | ICD-10-CM | POA: Diagnosis not present

## 2021-02-20 DIAGNOSIS — F411 Generalized anxiety disorder: Secondary | ICD-10-CM

## 2021-02-20 DIAGNOSIS — Z6841 Body Mass Index (BMI) 40.0 and over, adult: Secondary | ICD-10-CM | POA: Diagnosis not present

## 2021-02-20 DIAGNOSIS — I10 Essential (primary) hypertension: Secondary | ICD-10-CM | POA: Diagnosis not present

## 2021-02-20 DIAGNOSIS — R739 Hyperglycemia, unspecified: Secondary | ICD-10-CM

## 2021-02-20 DIAGNOSIS — F331 Major depressive disorder, recurrent, moderate: Secondary | ICD-10-CM

## 2021-02-20 DIAGNOSIS — E559 Vitamin D deficiency, unspecified: Secondary | ICD-10-CM

## 2021-02-20 MED ORDER — METOPROLOL SUCCINATE ER 50 MG PO TB24: 50.0000 mg | ORAL_TABLET | Freq: Every day | ORAL | 1 refills | Status: DC

## 2021-02-20 MED ORDER — CITALOPRAM HYDROBROMIDE 40 MG PO TABS: 40.0000 mg | ORAL_TABLET | Freq: Every day | ORAL | 1 refills | Status: DC

## 2021-02-20 NOTE — Assessment & Plan Note (Signed)
Reviewed last lipid panel Not currently on a statin Recheck FLP and CMP Discussed diet and exercise  

## 2021-02-20 NOTE — Progress Notes (Signed)
Established patient visit   Patient: Mary Villegas   DOB: [birth year]   55 y.o. Female  MRN: [id number] Visit Date: 02/20/2021  Today's healthcare provider: Shirlee Latch, MD   Chief Complaint  Patient presents with  . Hypertension  . Depression  . Hyperlipidemia   Subjective    HPI  Hypertension, follow-up  BP Readings from Last 3 Encounters:  02/20/21 (!) 167/96  12/27/19 128/76  12/10/19 (!) 152/91   Wt Readings from Last 3 Encounters:  02/20/21 247 lb (112 kg)  12/27/19 239 lb (108.4 kg)  12/06/19 240 lb (108.9 kg)     She was last seen for hypertension 08/31/2019.    Management since that visit includes continue same medications.  She reports good compliance with treatment. She is not having side effects.  She is following a Regular diet. She is not exercising. She does not smoke.  Use of agents associated with hypertension: none.   Outside blood pressures are not checked. Symptoms: No chest pain No chest pressure  No palpitations No syncope  Yes dyspnea No orthopnea  No paroxysmal nocturnal dyspnea No lower extremity edema   Pertinent labs: Lab Results  Component Value Date   CHOL 260 (H) 08/31/2019   HDL 37 (L) 08/31/2019   LDLCALC 171 (H) 08/31/2019   TRIG 106 12/06/2019   CHOLHDL 7.0 (H) 08/31/2019   Lab Results  Component Value Date   NA 141 12/27/2019   K 4.0 12/27/2019   CREATININE 1.10 (H) 12/27/2019   GFRNONAA 57 (L) 12/27/2019   GFRAA 65 12/27/2019   GLUCOSE 96 12/27/2019     The 10-year ASCVD risk score Denman George DC Jr., et al., 2013) is: 9.1%   ---------------------------------------------------------------------------------------------------  Depression, Follow-up  She  was last seen for this 08/31/2019.   Changes made at last visit include increased Celexa to 40mg  daily.   She reports good compliance with treatment. She is not having side effects.   She reports good tolerance of treatment. Current symptoms  include: depressed mood She feels she is Unchanged since last visit.  Depression screen [facility] 2/9 02/20/2021 08/31/2019  Decreased Interest 0 1  Down, Depressed, Hopeless 0 1  PHQ - 2 Score 0 2  Altered sleeping 0 3  Tired, decreased energy 3 3  Change in appetite 0 0  Feeling bad or failure about yourself  0 1  Trouble concentrating 3 3  Moving slowly or fidgety/restless 0 1  Suicidal thoughts 0 0  PHQ-9 Score 6 13  Difficult doing work/chores Not difficult at all Somewhat difficult    -----------------------------------------------------------------------------------------  Follow up for Vitamin D Deficiency:  The patient was last seen for this 08/31/2019. Changes made at last visit include recommending supplementation with over-the-counter vitamin D3 1000 to 2000 units daily.  She reports poor compliance with treatment. She feels that condition is Unchanged. She is not having side effects.   ----------------------------------------------------------------------------------------- Lipid/Cholesterol, Follow-up  Last lipid panel Other pertinent labs  Lab Results  Component Value Date   CHOL 260 (H) 08/31/2019   HDL 37 (L) 08/31/2019   LDLCALC 171 (H) 08/31/2019   TRIG 106 12/06/2019   CHOLHDL 7.0 (H) 08/31/2019   Lab Results  Component Value Date   ALT 19 12/27/2019   AST 18 12/27/2019   PLT 294 12/27/2019   TSH 1.110 08/02/2018     She was last seen for this more than 1 year ago.  Management since that visit includes recommending diet low in saturated  fat and regular exercise - 30 min at least 5 times per week.  She reports good compliance with treatment. She is not having side effects.   Symptoms: No chest pain No chest pressure/discomfort  Yes dyspnea No lower extremity edema  No numbness or tingling of extremity No orthopnea  No palpitations No paroxysmal nocturnal dyspnea  No speech difficulty No syncope   Current diet: well balanced Current exercise:  none  The 10-year ASCVD risk score Denman George(Goff DC Jr., et al., 2013) is: 9.1%  ---------------------------------------------------------------------------------------------------  Hyperglycemia, Follow-up  Lab Results  Component Value Date   HGBA1C 5.4% 06/19/2015   GLUCOSE 96 12/27/2019   GLUCOSE 177 (H) 12/10/2019   GLUCOSE 161 (H) 12/09/2019    Last seen for for this more than 1 year ago.  Current symptoms include none and have been stable.  Prior visit with dietician: no Current diet: well balanced Current exercise: none  Pertinent Labs:    Component Value Date/Time   CHOL 260 (H) 08/31/2019 1007   TRIG 106 12/06/2019 2043   CHOLHDL 7.0 (H) 08/31/2019 1007   CREATININE 1.10 (H) 12/27/2019 1217    Wt Readings from Last 3 Encounters:  02/20/21 247 lb (112 kg)  12/27/19 239 lb (108.4 kg)  12/06/19 240 lb (108.9 kg)    -----------------------------------------------------------------------------------------   Social History   Tobacco Use  . Smoking status: Former Smoker    Packs/day: 0.50    Years: 20.00    Pack years: 10.00    Types: Cigarettes  . Smokeless tobacco: Never Used  . Tobacco comment: QUIT IN 2005  Substance Use Topics  . Alcohol use: No    Alcohol/week: 0.0 standard drinks  . Drug use: No       Medications: Outpatient Medications Prior to Visit  Medication Sig  . hydrOXYzine (VISTARIL) 50 MG capsule One daily as needed for anxiety  . [DISCONTINUED] citalopram (CELEXA) 40 MG tablet Take 1 tablet (40 mg total) by mouth daily.  . [DISCONTINUED] metoprolol succinate (TOPROL-XL) 50 MG 24 hr tablet Take 1 tablet by mouth once daily  . albuterol (VENTOLIN HFA) 108 (90 Base) MCG/ACT inhaler Inhale 2 puffs into the lungs every 6 (six) hours. (Patient not taking: Reported on 02/20/2021)  . [DISCONTINUED] cholecalciferol (VITAMIN D) 1000 UNITS tablet Take 1 tablet by mouth daily. (Patient not taking: Reported on 02/20/2021)  . [DISCONTINUED] doxycycline  (VIBRA-TABS) 100 MG tablet Take 1 tablet (100 mg total) by mouth 2 (two) times daily.  . [DISCONTINUED] MAGNESIUM PO Take 1 tablet by mouth daily. (Patient not taking: Reported on 02/20/2021)  . [DISCONTINUED] predniSONE (DELTASONE) 20 MG tablet Take 1 tablet (20 mg total) by mouth daily with breakfast.   No facility-administered medications prior to visit.    Review of Systems  Constitutional: Negative for appetite change, chills, fatigue and fever.  Respiratory: Negative for chest tightness and shortness of breath.   Cardiovascular: Negative for chest pain and palpitations.  Gastrointestinal: Negative for abdominal pain, nausea and vomiting.  Neurological: Negative for dizziness and weakness.       Objective    BP (!) 167/96 (BP Location: Right Wrist)   Pulse 87   Temp 98.1 F (36.7 C) (Oral)   Resp 18   Wt 247 lb (112 kg)   LMP  (LMP Unknown)   SpO2 99% Comment: room air  BMI 41.10 kg/m     Physical Exam Vitals reviewed.  Constitutional:      General: She is not in acute distress.  Appearance: Normal appearance. She is well-developed. She is not diaphoretic.  HENT:     Head: Normocephalic and atraumatic.  Eyes:     General: No scleral icterus.    Conjunctiva/sclera: Conjunctivae normal.  Neck:     Thyroid: No thyromegaly.  Cardiovascular:     Rate and Rhythm: Normal rate and regular rhythm.     Pulses: Normal pulses.     Heart sounds: Normal heart sounds. No murmur heard.   Pulmonary:     Effort: Pulmonary effort is normal. No respiratory distress.     Breath sounds: Normal breath sounds. No wheezing, rhonchi or rales.  Musculoskeletal:     Cervical back: Neck supple.     Right lower leg: No edema.     Left lower leg: No edema.  Lymphadenopathy:     Cervical: No cervical adenopathy.  Skin:    General: Skin is warm and dry.     Findings: No rash.  Neurological:     Mental Status: She is alert and oriented to person, place, and time. Mental status is at  baseline.  Psychiatric:        Mood and Affect: Mood normal.        Behavior: Behavior normal.       No results found for any visits on 02/20/21.  Assessment & Plan     Problem List Items Addressed This Visit      Cardiovascular and Mediastinum   Essential hypertension - Primary    Chronic and uncontrolled Patient believes this is related to stress today She does not want to change her medications today Resume metoprolol at previous dose Recheck metabolic panel Encouraged checking home blood pressures Follow-up in 3 months to reassess      Relevant Medications   metoprolol succinate (TOPROL-XL) 50 MG 24 hr tablet   Other Relevant Orders   Comprehensive metabolic panel     Other   Hypercholesterolemia without hypertriglyceridemia    Reviewed last lipid panel Not currently on a statin Recheck FLP and CMP Discussed diet and exercise       Relevant Medications   metoprolol succinate (TOPROL-XL) 50 MG 24 hr tablet   Other Relevant Orders   Lipid panel   Comprehensive metabolic panel   Avitaminosis D   Relevant Orders   VITAMIN D 25 Hydroxy (Vit-D Deficiency, Fractures)   Moderate episode of recurrent major depressive disorder (HCC)    Chronic and fairly well controlled Exacerbated by death of her brother and disability in her daughter due to COVID Will continue celexa at current dose Recommend therapy      Relevant Medications   citalopram (CELEXA) 40 MG tablet   GAD (generalized anxiety disorder)    Chronic and stable Some exacerbation recently related to the disability of her daughter and caregiving duties and death of her brother We will continue Celexa at current dose Consider therapy      Relevant Medications   citalopram (CELEXA) 40 MG tablet   Morbid obesity (HCC)    Discussed importance of healthy weight management Discussed diet and exercise        Other Visit Diagnoses    BMI 40.0-44.9, adult (HCC)       Hyperglycemia       Relevant  Orders   Hemoglobin A1c   Essential (primary) hypertension       Relevant Medications   metoprolol succinate (TOPROL-XL) 50 MG 24 hr tablet        Return in about 3 months (around 05/23/2021)  for chronic disease f/u, CPE.      I, Shirlee Latch, MD, have reviewed all documentation for this visit. The documentation on 02/20/21 for the exam, diagnosis, procedures, and orders are all accurate and complete.   Mikiah Durall, Marzella Schlein, MD, MPH [facility]

## 2021-02-20 NOTE — Assessment & Plan Note (Signed)
Chronic and uncontrolled Patient believes this is related to stress today She does not want to change her medications today Resume metoprolol at previous dose Recheck metabolic panel Encouraged checking home blood pressures Follow-up in 3 months to reassess

## 2021-02-20 NOTE — Assessment & Plan Note (Signed)
Discussed importance of healthy weight management Discussed diet and exercise  

## 2021-02-20 NOTE — Assessment & Plan Note (Signed)
Chronic and stable Some exacerbation recently related to the disability of her daughter and caregiving duties and death of her brother We will continue Celexa at current dose Consider therapy

## 2021-02-20 NOTE — Assessment & Plan Note (Addendum)
Chronic and fairly well controlled Exacerbated by death of her brother and disability in her daughter due to COVID Will continue celexa at current dose Recommend therapy

## 2021-06-26 ENCOUNTER — Encounter: Payer: Self-pay | Admitting: Family Medicine

## 2021-06-26 NOTE — Progress Notes (Deleted)
Complete physical exam   Patient: Mary Villegas   DOB: December 07, 1966   55 y.o. Female  MRN: [id number] Visit Date: 06/26/2021  Today's healthcare provider: Shirlee Latch, MD   No chief complaint on file.  Subjective    Mary Villegas is a 55 y.o. female who presents today for a complete physical exam.  She reports consuming a {diet types:17450} diet. {Exercise:19826} She generally feels {well/fairly well/poorly:18703}. She reports sleeping {well/fairly well/poorly:18703}. She {does/does not:200015} have additional problems to discuss today.  HPI  ***  Past Medical History:  Diagnosis Date   Anxiety    Depression    Hypertension    Past Surgical History:  Procedure Laterality Date   APPENDECTOMY     TUBAL LIGATION  1992   Social History   Socioeconomic History   Marital status: Married    Spouse name: Not on file   Number of children: Not on file   Years of education: Not on file   Highest education level: Not on file  Occupational History   Not on file  Tobacco Use   Smoking status: Former    Packs/day: 0.50    Years: 20.00    Pack years: 10.00    Types: Cigarettes   Smokeless tobacco: Never   Tobacco comments:    QUIT IN 2005  Substance and Sexual Activity   Alcohol use: No    Alcohol/week: 0.0 standard drinks   Drug use: No   Sexual activity: Not on file  Other Topics Concern   Not on file  Social History Narrative   Not on file   Social Determinants of Health   Financial Resource Strain: Not on file  Food Insecurity: Not on file  Transportation Needs: Not on file  Physical Activity: Not on file  Stress: Not on file  Social Connections: Not on file  Intimate Partner Violence: Not on file   Family Status  Relation Name Status   Mother  Deceased at age 41   Father  Deceased at age 18   Sister  Alive   Brother  Alive   Daughter 1 Deceased at age 21 MONTHS   Son 1 Alive   MGM  Deceased   Daughter 2 Alive   Daughter 3 Alive    Family History  Problem Relation Age of Onset   Melanoma Mother    Diabetes Father    Cirrhosis Father    Alcohol abuse Father    Diabetes Sister    Diabetes Brother    Cirrhosis Maternal Grandmother    Diabetes Daughter    Allergies  Allergen Reactions   Atorvastatin     Other reaction(s): Muscle Pain   Lovastatin     myalgias   Pravastatin Sodium     myalgias   Simvastatin     Causes anxiety    Patient Care Team: Erasmo Downer, MD as PCP - General (Family Medicine)   Medications: Outpatient Medications Prior to Visit  Medication Sig   albuterol (VENTOLIN HFA) 108 (90 Base) MCG/ACT inhaler Inhale 2 puffs into the lungs every 6 (six) hours. (Patient not taking: Reported on 02/20/2021)   citalopram (CELEXA) 40 MG tablet Take 1 tablet (40 mg total) by mouth daily.   hydrOXYzine (VISTARIL) 50 MG capsule One daily as needed for anxiety   metoprolol succinate (TOPROL-XL) 50 MG 24 hr tablet Take 1 tablet (50 mg total) by mouth daily. Take with or immediately following a meal.   No facility-administered medications prior  to visit.    Review of Systems  All other systems reviewed and are negative.  {Labs  Heme  Chem  Endocrine  Serology  Results Review (optional):23779}  Objective    LMP  (LMP Unknown)  {Show previous vital signs (optional):23777}  Physical Exam  ***  Last depression screening scores PHQ 2/9 Scores 02/20/2021 08/31/2019  PHQ - 2 Score 0 2  PHQ- 9 Score 6 13   Last fall risk screening Fall Risk  02/20/2021  Falls in the past year? 0  Number falls in past yr: 0  Injury with Fall? 0  Follow up Falls evaluation completed   Last Audit-C alcohol use screening Alcohol Use Disorder Test (AUDIT) 02/20/2021  1. How often do you have a drink containing alcohol? 0  2. How many drinks containing alcohol do you have on a typical day when you are drinking? 0  3. How often do you have six or more drinks on one occasion? 0  AUDIT-C Score 0  Alcohol  Brief Interventions/Follow-up AUDIT Score <7 follow-up not indicated   A score of 3 or more in women, and 4 or more in men indicates increased risk for alcohol abuse, EXCEPT if all of the points are from question 1   No results found for any visits on 06/26/21.  Assessment & Plan    Routine Health Maintenance and Physical Exam  Exercise Activities and Dietary recommendations  Goals   None     Immunization History  Administered Date(s) Administered   Tdap 09/23/2010    Health Maintenance  Topic Date Due   PAP SMEAR-Modifier  Never done   COLONOSCOPY (Pts 45-49yrs Insurance coverage will need to be confirmed)  Never done   Zoster Vaccines- Shingrix (1 of 2) Never done   MAMMOGRAM  03/05/2019   TETANUS/TDAP  09/23/2020   COVID-19 Vaccine (1) 08/19/2021 (Originally 06/19/1971)   INFLUENZA VACCINE  07/13/2021   Hepatitis C Screening  Completed   HIV Screening  Completed   Pneumococcal Vaccine 50-71 Years old  Aged Out   HPV VACCINES  Aged Out    Discussed health benefits of physical activity, and encouraged her to engage in regular exercise appropriate for her age and condition.  ***  No follow-ups on file.     {provider attestation***:1}   Shirlee Latch, MD  [facility] [phone] (phone) [phone] (fax)  [facility]

## 2021-08-28 ENCOUNTER — Other Ambulatory Visit: Payer: Self-pay | Admitting: Family Medicine

## 2021-08-28 DIAGNOSIS — I10 Essential (primary) hypertension: Secondary | ICD-10-CM

## 2021-08-28 NOTE — Telephone Encounter (Signed)
Appointment 10/30/21

## 2021-09-13 ENCOUNTER — Emergency Department: Payer: BC Managed Care – PPO

## 2021-09-13 ENCOUNTER — Emergency Department
Admission: EM | Admit: 2021-09-13 | Discharge: 2021-09-13 | Disposition: A | Discharge status: Home / Self Care | Payer: BC Managed Care – PPO | Attending: Emergency Medicine | Admitting: Emergency Medicine

## 2021-09-13 ENCOUNTER — Other Ambulatory Visit: Payer: Self-pay

## 2021-09-13 DIAGNOSIS — M25551 Pain in right hip: Secondary | ICD-10-CM

## 2021-09-13 DIAGNOSIS — Z79899 Other long term (current) drug therapy: Secondary | ICD-10-CM | POA: Insufficient documentation

## 2021-09-13 DIAGNOSIS — S93492A Sprain of other ligament of left ankle, initial encounter: Secondary | ICD-10-CM | POA: Diagnosis not present

## 2021-09-13 DIAGNOSIS — M25562 Pain in left knee: Secondary | ICD-10-CM

## 2021-09-13 DIAGNOSIS — S99912A Unspecified injury of left ankle, initial encounter: Secondary | ICD-10-CM | POA: Diagnosis not present

## 2021-09-13 DIAGNOSIS — W19XXXA Unspecified fall, initial encounter: Secondary | ICD-10-CM | POA: Diagnosis not present

## 2021-09-13 DIAGNOSIS — Z87891 Personal history of nicotine dependence: Secondary | ICD-10-CM | POA: Insufficient documentation

## 2021-09-13 DIAGNOSIS — M7732 Calcaneal spur, left foot: Secondary | ICD-10-CM | POA: Diagnosis not present

## 2021-09-13 DIAGNOSIS — I10 Essential (primary) hypertension: Secondary | ICD-10-CM | POA: Insufficient documentation

## 2021-09-13 MED ORDER — PREDNISONE 10 MG PO TABS: 10.0000 mg | ORAL_TABLET | Freq: Every day | ORAL | 0 refills | Status: AC

## 2021-09-13 NOTE — ED Triage Notes (Signed)
Pt comes with c/o right hip pain and right knee and ankle pain. Pt has some swelling noted to ankle. No LOC or hitting head.

## 2021-09-13 NOTE — ED Provider Notes (Signed)
Emergency Medicine Provider Triage Evaluation Note  Mary Villegas , a 55 y.o. female  was evaluated in triage.  Pt complains of right hip, left knee, left foot pain and swelling status post fall.  Review of Systems  Positive: Right hip pain, left knee pain, left foot pain and swelling. Negative: Back pain  Physical Exam  BP (!) 153/83   Pulse 85   Temp 98.4 F (36.9 C)   Resp 17   LMP  (LMP Unknown)   SpO2 95%  Gen:   Awake, no distress   Resp:  Normal effort  MSK:   Normal flexion, extension and rotation of the left ankle.  Slight swelling noted over the dorsal aspect of the mid metatarsals.   Medical Decision Making    Xray left foot ordered  Medically screening exam initiated at 8:18 AM.  Appropriate orders placed.  SIVAN CUELLO was informed that the remainder of the evaluation will be completed by another provider, this initial triage assessment does not replace that evaluation, and the importance of remaining in the ED until their evaluation is complete.     Lorre Munroe, NP 09/13/21 3005    Gilles Chiquito, MD 09/13/21 [phone]

## 2021-09-13 NOTE — ED Provider Notes (Signed)
[facility] Emergency Department Provider Note ____________________________________________  Time seen: 0935  I have reviewed the triage vital signs and the nursing notes.  HISTORY  Chief Complaint  Fall   HPI [name] is a 55 y.o. female presents to the ER today with complaint of acute right hip pain, left knee pain and left foot pain and swelling status post a fall.  She reports she was trying to walk her dog earlier this morning and she slipped and fell.  She reports this is due to the deck being wet.  She reports her right foot gave out from under her and she landed on her right hip and her left knee with her left foot up under her.  She describes the pain as numb initially but now it is throbbing.  She was initially not able to weight-bear but is able to bear some weight now.  She did not take any medications OTC for this.  Past Medical History:  Diagnosis Date   Anxiety    Depression    Hypertension     Patient Active Problem List   Diagnosis Date Noted   Moderate episode of recurrent major depressive disorder (HCC) 08/31/2019   GAD (generalized anxiety disorder) 08/31/2019   Morbid obesity (HCC) 08/31/2019   Absence of menstruation 06/18/2015   Pain in shoulder 06/18/2015   Dermatitis, eczematoid 06/18/2015   Essential hypertension 06/18/2015   LBP (low back pain) 06/18/2015   Cervical pain 06/18/2015   Avitaminosis D 06/18/2015   Hypercholesterolemia without hypertriglyceridemia 10/13/2007    Past Surgical History:  Procedure Laterality Date   APPENDECTOMY     TUBAL LIGATION  1992    Prior to Admission medications   Medication Sig Start Date End Date Taking? Authorizing Provider  predniSONE (DELTASONE) 10 MG tablet Take 1 tablet (10 mg total) by mouth daily for 5 days. 09/13/21 09/18/21 Yes Yaneth Fairbairn, Salvadore Oxford, NP  albuterol (VENTOLIN HFA) 108 (90 Base) MCG/ACT inhaler Inhale 2 puffs into the lungs every 6 (six) hours. Patient not  taking: Reported on 02/20/2021 09/22/20   Margaretann Loveless, PA-C  citalopram (CELEXA) 40 MG tablet Take 1 tablet by mouth once daily 08/28/21   Erasmo Downer, MD  hydrOXYzine (VISTARIL) 50 MG capsule One daily as needed for anxiety 07/31/18   Anola Gurney, PA  metoprolol succinate (TOPROL-XL) 50 MG 24 hr tablet TAKE 1 TABLET BY MOUTH ONCE DAILY (TAKE  WITH  OR  IMMEDIATELY  FOLLOWING  A  MEAL) 08/28/21   Bacigalupo, Marzella Schlein, MD    Allergies Atorvastatin, Lovastatin, Pravastatin sodium, and Simvastatin  Family History  Problem Relation Age of Onset   Melanoma Mother    Diabetes Father    Cirrhosis Father    Alcohol abuse Father    Diabetes Sister    Diabetes Brother    Cirrhosis Maternal Grandmother    Diabetes Daughter     Social History Social History   Tobacco Use   Smoking status: Former    Packs/day: 0.50    Years: 20.00    Pack years: 10.00    Types: Cigarettes   Smokeless tobacco: Never   Tobacco comments:    QUIT IN 2005  Substance Use Topics   Alcohol use: No    Alcohol/week: 0.0 standard drinks   Drug use: No    Review of Systems  Constitutional: Negative for fever, chills, or body aches. Eyes: Negative for visual changes. Cardiovascular: Negative for chest pain or chest tightness. Respiratory: Negative  for cough or shortness of breath. Musculoskeletal: Positive for  right hip pain, left knee pain, left ankle/foot pain and swelling Skin: Negative for bruising or abrasion. Neurological: Positive for focal weakness of the LLE. Negative for tingling or numbness. ____________________________________________  PHYSICAL EXAM:  VITAL SIGNS: ED Triage Vitals  Enc Vitals Group     BP 09/13/21 0814 (!) 153/83     Pulse Rate 09/13/21 0814 85     Resp 09/13/21 0814 17     Temp 09/13/21 0814 98.4 F (36.9 C)     Temp src --      SpO2 09/13/21 0814 95 %     Weight --      Height --      Head Circumference --      Peak Flow --      Pain Score  09/13/21 0812 8     Pain Loc --      Pain Edu? --      Excl. in GC? --     Constitutional: Alert and oriented. Appears uncomfortable but in no distress. Head: Normocephalic and atraumatic. Eyes:  Normal extraocular movements Cardiovascular: Normal rate, regular rhythm. Pedal pulse 2+ on the left. Respiratory: Normal respiratory effort. No wheezes/rales/rhonchi noted. Musculoskeletal: Normal flexion and extension of the right hip. Normal flexion and extension of the left knee. Normal flexion, extension and rotation of the let ankle. 1+ swelling noted over the 1st-3rd proximal metacarpals. She is able to bear weight. Neurologic: Normal speech and language. No gross focal neurologic deficits are appreciated. Skin:  Skin is warm, dry and intact. No bruising or abrasions noted. ____________________________________________   RADIOLOGY Imaging Orders         DG Foot Complete Left    IMPRESSION:  No acute osseous abnormality.       INITIAL IMPRESSION / ASSESSMENT AND PLAN / ED COURSE  Right Hip Pain, Left Knee Pain, Left Foot Pain Ankle and Swelling s/p Fall:  DDx include right hip contusion, pateooar fracture, left ankle sprain, left foot fracture Xray of left foot negative for fracture No indication for x-ray right hip or left knee at this time Ace wrap applied Rx for Prednisone 10 mg daily x 5 days (unable to give NSAIDs due to impaired kidney function) Apply ice for 10 minutes 3 times daily as needed Follow-up with PCP if symptoms persist or worsen. ____________________________________________  FINAL CLINICAL IMPRESSION(S) / ED DIAGNOSES  Final diagnoses:  Sprain of anterior talofibular ligament of left ankle, initial encounter  Acute pain of left knee  Right hip pain  Fall, initial encounter      Lorre Munroe, NP 09/13/21 0959    Gilles Chiquito, MD 09/13/21 [phone]

## 2021-09-13 NOTE — Discharge Instructions (Signed)
You were seen today for left foot pain and swelling, left knee pain and right hip pain.  Your x-ray does not show any acute fracture of the left foot or ankle.  We recommend rest, ice, compression and elevation.  I have sent in steroids for you to take daily for the next 5 days.  You may apply ice for 10 minutes 3 times daily.  Follow-up with your PCP if symptoms persist or worsen.

## 2021-10-30 ENCOUNTER — Encounter: Payer: Self-pay | Admitting: Family Medicine

## 2021-10-30 ENCOUNTER — Ambulatory Visit (INDEPENDENT_AMBULATORY_CARE_PROVIDER_SITE_OTHER): Payer: BC Managed Care – PPO | Admitting: Family Medicine

## 2021-10-30 ENCOUNTER — Other Ambulatory Visit: Payer: Self-pay

## 2021-10-30 VITALS — BP 142/78 | HR 77 | Temp 98.3°F | Ht 65.0 in | Wt 241.7 lb

## 2021-10-30 DIAGNOSIS — E559 Vitamin D deficiency, unspecified: Secondary | ICD-10-CM | POA: Diagnosis not present

## 2021-10-30 DIAGNOSIS — F411 Generalized anxiety disorder: Secondary | ICD-10-CM

## 2021-10-30 DIAGNOSIS — Z23 Encounter for immunization: Secondary | ICD-10-CM | POA: Diagnosis not present

## 2021-10-30 DIAGNOSIS — R739 Hyperglycemia, unspecified: Secondary | ICD-10-CM

## 2021-10-30 DIAGNOSIS — I10 Essential (primary) hypertension: Secondary | ICD-10-CM

## 2021-10-30 DIAGNOSIS — F331 Major depressive disorder, recurrent, moderate: Secondary | ICD-10-CM

## 2021-10-30 DIAGNOSIS — E7800 Pure hypercholesterolemia, unspecified: Secondary | ICD-10-CM | POA: Diagnosis not present

## 2021-10-30 MED ORDER — AMLODIPINE BESYLATE 5 MG PO TABS: 5.0000 mg | ORAL_TABLET | Freq: Every day | ORAL | 1 refills | Status: AC

## 2021-10-30 MED ORDER — CITALOPRAM HYDROBROMIDE 40 MG PO TABS: 40.0000 mg | ORAL_TABLET | Freq: Every day | ORAL | 1 refills | Status: DC

## 2021-10-30 MED ORDER — BUPROPION HCL ER (XL) 150 MG PO TB24: 150.0000 mg | ORAL_TABLET | Freq: Every day | ORAL | 1 refills | Status: AC

## 2021-10-30 NOTE — Assessment & Plan Note (Signed)
Chronic and uncontrolled Continue celexa 40mg  daily Add wellbutrin XL 150mg  daily Contracted for safety - no SI/HI Have discussed therapy previously F/u in 56m and repeat PHQ-9 and GAD-7

## 2021-10-30 NOTE — Assessment & Plan Note (Signed)
Discussed importance of healthy weight management Discussed diet and exercise  

## 2021-10-30 NOTE — Assessment & Plan Note (Signed)
Chronic and uncontrolled Monitor home BPs Continue metoprolol Add amlodipine 5 mg daily Check metabolic panel F/u in 32m

## 2021-10-30 NOTE — Progress Notes (Signed)
Established patient visit   Patient: Mary Villegas   DOB: [birth year]   55 y.o. Female  MRN: [id number] Visit Date: 10/30/2021  Today's healthcare provider: Shirlee Latch, MD   Chief Complaint  Patient presents with   Hypertension   Anxiety   Follow-up   Subjective    HPI  Hypertension, follow-up  BP Readings from Last 3 Encounters:  10/30/21 (!) 142/78  09/13/21 (!) 153/83  02/20/21 (!) 167/96   Wt Readings from Last 3 Encounters:  10/30/21 241 lb 11.2 oz (109.6 kg)  02/20/21 247 lb (112 kg)  12/27/19 239 lb (108.4 kg)     She was last seen for hypertension 8 months ago.  BP at that visit was 167/96. Management since that visit includes she is taking metoprolol 50mg  daily.  She reports excellent compliance with treatment. She is not having side effects.  She is following a Regular diet. She is not exercising. She does not smoke.  Use of agents associated with hypertension: none.   Outside blood pressures are not being checked. Symptoms: No chest pain No chest pressure  No palpitations No syncope  No dyspnea No orthopnea  No paroxysmal nocturnal dyspnea No lower extremity edema   Pertinent labs: Lab Results  Component Value Date   CHOL 260 (H) 08/31/2019   HDL 37 (L) 08/31/2019   LDLCALC 171 (H) 08/31/2019   TRIG 106 12/06/2019   CHOLHDL 7.0 (H) 08/31/2019   Lab Results  Component Value Date   NA 141 12/27/2019   K 4.0 12/27/2019   CREATININE 1.10 (H) 12/27/2019   GFRNONAA 57 (L) 12/27/2019   GLUCOSE 96 12/27/2019   TSH 1.110 08/02/2018     The 10-year ASCVD risk score (Arnett DK, et al., 2019) is: 7.1%   ---------------------------------------------------------------------------------------------------  States she is having more anxiety and depression lately. Lots of stress. Feels like she needs a higher dose of her SSRI.    Medications: Outpatient Medications Prior to Visit  Medication Sig   hydrOXYzine (VISTARIL) 50 MG  capsule One daily as needed for anxiety   metoprolol succinate (TOPROL-XL) 50 MG 24 hr tablet TAKE 1 TABLET BY MOUTH ONCE DAILY (TAKE  WITH  OR  IMMEDIATELY  FOLLOWING  A  MEAL)   [DISCONTINUED] citalopram (CELEXA) 40 MG tablet Take 1 tablet by mouth once daily   albuterol (VENTOLIN HFA) 108 (90 Base) MCG/ACT inhaler Inhale 2 puffs into the lungs every 6 (six) hours. (Patient not taking: Reported on 02/20/2021)   No facility-administered medications prior to visit.    Review of Systems - per HPI     Objective    BP (!) 142/78 (BP Location: Right Arm, Patient Position: Sitting, Cuff Size: Normal)   Pulse 77   Temp 98.3 F (36.8 C) (Oral)   Ht 5\' 5"  (1.651 m)   Wt 241 lb 11.2 oz (109.6 kg)   LMP  (LMP Unknown)   SpO2 98%   BMI 40.22 kg/m  {Show previous vital signs (optional):23777}  Physical Exam Vitals reviewed.  Constitutional:      General: She is not in acute distress.    Appearance: Normal appearance. She is well-developed. She is not diaphoretic.  HENT:     Head: Normocephalic and atraumatic.  Eyes:     General: No scleral icterus.    Conjunctiva/sclera: Conjunctivae normal.  Neck:     Thyroid: No thyromegaly.  Cardiovascular:     Rate and Rhythm: Normal rate and regular rhythm.  Pulses: Normal pulses.     Heart sounds: Normal heart sounds. No murmur heard. Pulmonary:     Effort: Pulmonary effort is normal. No respiratory distress.     Breath sounds: Normal breath sounds. No wheezing, rhonchi or rales.  Musculoskeletal:     Cervical back: Neck supple.     Right lower leg: No edema.     Left lower leg: No edema.  Lymphadenopathy:     Cervical: No cervical adenopathy.  Skin:    General: Skin is warm and dry.     Findings: No rash.  Neurological:     Mental Status: She is alert and oriented to person, place, and time. Mental status is at baseline.  Psychiatric:        Mood and Affect: Mood normal.        Behavior: Behavior normal.      No results found  for any visits on 10/30/21.  Assessment & Plan     Problem List Items Addressed This Visit       Cardiovascular and Mediastinum   Essential hypertension - Primary    Chronic and uncontrolled Monitor home BPs Continue metoprolol Add amlodipine 5 mg daily Check metabolic panel F/u in 78m      Relevant Medications   amLODipine (NORVASC) 5 MG tablet   Other Relevant Orders   Comprehensive metabolic panel     Other   Hypercholesterolemia without hypertriglyceridemia    Reviewed last lipid panel Not currently on a statin Recheck FLP and CMP Discussed diet and exercise       Relevant Medications   amLODipine (NORVASC) 5 MG tablet   Other Relevant Orders   Lipid panel   Comprehensive metabolic panel   Avitaminosis D   Relevant Orders   VITAMIN D 25 Hydroxy (Vit-D Deficiency, Fractures)   Moderate episode of recurrent major depressive disorder (HCC)    Chronic and uncontrolled Contract for safety-no SI/HI Continue Celexa 40 mg daily Add Wellbutrin XL 150 mg daily Discussed possible side effects Have discussed therapy in the past Follow-up in 3 months and repeat PHQ-9 and GAD-7      Relevant Medications   citalopram (CELEXA) 40 MG tablet   buPROPion (WELLBUTRIN XL) 150 MG 24 hr tablet   GAD (generalized anxiety disorder)    Chronic and uncontrolled Continue celexa 40mg  daily Add wellbutrin XL 150mg  daily Contracted for safety - no SI/HI Have discussed therapy previously F/u in 22m and repeat PHQ-9 and GAD-7      Relevant Medications   citalopram (CELEXA) 40 MG tablet   buPROPion (WELLBUTRIN XL) 150 MG 24 hr tablet   Morbid obesity (HCC)    Discussed importance of healthy weight management Discussed diet and exercise       Relevant Orders   Lipid panel   Comprehensive metabolic panel   Hemoglobin A1c   Other Visit Diagnoses     Hyperglycemia       Relevant Orders   Hemoglobin A1c   Need for influenza vaccination       Relevant Orders   Flu Vaccine  QUAD 107mo+IM (Fluarix, Fluzone & Alfiuria Quad PF)        Return in about 3 months (around 01/30/2022) for chronic disease f/u.      I, 5mo, MD, have reviewed all documentation for this visit. The documentation on 10/30/21 for the exam, diagnosis, procedures, and orders are all accurate and complete.   Brinley Rosete, Shirlee Latch, MD, MPH [facility]

## 2021-10-30 NOTE — Assessment & Plan Note (Signed)
Reviewed last lipid panel Not currently on a statin Recheck FLP and CMP Discussed diet and exercise  

## 2021-10-30 NOTE — Assessment & Plan Note (Signed)
Chronic and uncontrolled Contract for safety-no SI/HI Continue Celexa 40 mg daily Add Wellbutrin XL 150 mg daily Discussed possible side effects Have discussed therapy in the past Follow-up in 3 months and repeat PHQ-9 and GAD-7

## 2021-10-31 LAB — LIPID PANEL
Chol/HDL Ratio: 8.6 ratio — ABNORMAL HIGH (ref 0.0–4.4)
Cholesterol, Total: 268 mg/dL — ABNORMAL HIGH (ref 100–199)
HDL: 31 mg/dL — ABNORMAL LOW (ref >39)
LDL Chol Calc (NIH): 182 mg/dL — ABNORMAL HIGH (ref 0–99)
Triglycerides: 281 mg/dL — ABNORMAL HIGH (ref 0–149)
VLDL Cholesterol Cal: 55 mg/dL — ABNORMAL HIGH (ref 5–40)

## 2021-10-31 LAB — COMPREHENSIVE METABOLIC PANEL
ALT: 18 IU/L (ref 0–32)
AST: 19 IU/L (ref 0–40)
Albumin/Globulin Ratio: 1.9 (ref 1.2–2.2)
Albumin: 4.3 g/dL (ref 3.8–4.9)
Alkaline Phosphatase: 119 IU/L (ref 44–121)
BUN/Creatinine Ratio: 13 (ref 9–23)
BUN: 14 mg/dL (ref 6–24)
Bilirubin Total: 0.3 mg/dL (ref 0.0–1.2)
CO2: 25 mmol/L (ref 20–29)
Calcium: 9.1 mg/dL (ref 8.7–10.2)
Chloride: 102 mmol/L (ref 96–106)
Creatinine, Ser: 1.1 mg/dL — ABNORMAL HIGH (ref 0.57–1.00)
Globulin, Total: 2.3 g/dL (ref 1.5–4.5)
Glucose: 73 mg/dL (ref 70–99)
Potassium: 4.8 mmol/L (ref 3.5–5.2)
Sodium: 141 mmol/L (ref 134–144)
Total Protein: 6.6 g/dL (ref 6.0–8.5)
eGFR: 59 mL/min/{1.73_m2} — ABNORMAL LOW (ref >59)

## 2021-10-31 LAB — VITAMIN D 25 HYDROXY (VIT D DEFICIENCY, FRACTURES): Vit D, 25-Hydroxy: 71.1 ng/mL (ref 30.0–100.0)

## 2021-10-31 LAB — HEMOGLOBIN A1C
Est. average glucose Bld gHb Est-mCnc: 117 mg/dL
Hgb A1c MFr Bld: 5.7 % — ABNORMAL HIGH (ref 4.8–5.6)

## 2021-11-26 ENCOUNTER — Other Ambulatory Visit: Payer: Self-pay | Admitting: Family Medicine

## 2021-11-26 DIAGNOSIS — I10 Essential (primary) hypertension: Secondary | ICD-10-CM

## 2021-12-18 ENCOUNTER — Other Ambulatory Visit: Payer: Self-pay | Admitting: Physician Assistant

## 2022-02-02 ENCOUNTER — Ambulatory Visit: Payer: BC Managed Care – PPO | Admitting: Family Medicine

## 2022-02-24 ENCOUNTER — Other Ambulatory Visit: Payer: Self-pay | Admitting: Family Medicine

## 2022-02-24 DIAGNOSIS — I10 Essential (primary) hypertension: Secondary | ICD-10-CM

## 2022-02-24 NOTE — Telephone Encounter (Signed)
Requested Prescriptions  ?Pending Prescriptions Disp Refills  ?? metoprolol succinate (TOPROL-XL) 50 MG 24 hr tablet [Pharmacy Med Name: Metoprolol Succinate ER 50 MG Oral Tablet Extended Release 24 Hour] 90 tablet 0  ?  Sig: TAKE 1 TABLET BY MOUTH ONCE DAILY WITH  OR  IMMEDIATELY  FOLLOWING  A  MEAL  ?  ? Cardiovascular:  Beta Blockers Failed - 02/24/2022  1:48 AM  ?  ?  Failed - Last BP in normal range  ?  BP Readings from Last 1 Encounters:  ?10/30/21 (!) 142/78  ?   ?  ?  Passed - Last Heart Rate in normal range  ?  Pulse Readings from Last 1 Encounters:  ?10/30/21 77  ?   ?  ?  Passed - Valid encounter within last 6 months  ?  Recent Outpatient Visits   ?      ? 3 months ago Essential hypertension  ? [facility] Bacigalupo, Marzella Schlein, MD  ? 1 year ago Essential hypertension  ? [facility] [place], Marzella Schlein, MD  ? 1 year ago Fever, unspecified fever cause  ? [facility] [place], [place], [place]  ? 2 years ago Lymphadenopathy, axillary  ? [facility] [place], [place], [place]  ? 2 years ago Dry skin  ? [facility] Bacigalupo, Marzella Schlein, MD  ?  ?  ?Future Appointments   ?        ? In 1 week Bacigalupo, Marzella Schlein, MD [facility]  ?  ? ?  ?  ?  ? ?

## 2022-03-04 NOTE — Progress Notes (Deleted)
?  ? ? ?Established patient visit ? ? ?Patient: Mary Villegas   DOB: 15-Feb-1966   56 y.o. Female  MRN: [id number] ?Visit Date: 03/05/2022 ? ?Today's healthcare provider: Lavon Paganini, MD  ? ?No chief complaint on file. ? ?Subjective  ?  ?HPI  ?Hypertension, follow-up ? ?BP Readings from Last 3 Encounters:  ?10/30/21 (!) 142/78  ?09/13/21 (!) 153/83  ?02/20/21 (!) 167/96  ? Wt Readings from Last 3 Encounters:  ?10/30/21 241 lb 11.2 oz (109.6 kg)  ?02/20/21 247 lb (112 kg)  ?12/27/19 239 lb (108.4 kg)  ?  ? ?She was last seen for hypertension 4 months ago.  ?BP at that visit was 142/78. Management since that visit includes add amlodipine 80m daily and continue metoprolol. ? ?She reports {excellent/good/fair/poor:19665} compliance with treatment. ?She {is/is not:9024} having side effects. {document side effects if present:1} ?She is following a {diet:21022986} diet. ?She {is/is not:9024} exercising. ?She {does/does not:200015} smoke. ? ?Use of agents associated with hypertension: {bp agents assoc with hypertension:511::"none"}.  ? ?Outside blood pressures are {***enter patient reported home BP readings, or 'not being checked':1}. ?Symptoms: ?{Yes/No:20286} chest pain {Yes/No:20286} chest pressure  ?{Yes/No:20286} palpitations {Yes/No:20286} syncope  ?{Yes/No:20286} dyspnea {Yes/No:20286} orthopnea  ?{Yes/No:20286} paroxysmal nocturnal dyspnea {Yes/No:20286} lower extremity edema  ? ?Pertinent labs: ?Lab Results  ?Component Value Date  ? CHOL 268 (H) 10/30/2021  ? HDL 31 (L) 10/30/2021  ? LDLCALC 182 (H) 10/30/2021  ? TRIG 281 (H) 10/30/2021  ? CHOLHDL 8.6 (H) 10/30/2021  ? Lab Results  ?Component Value Date  ? NA 141 10/30/2021  ? K 4.8 10/30/2021  ? CREATININE 1.10 (H) 10/30/2021  ? EGFR 59 (L) 10/30/2021  ? GLUCOSE 73 10/30/2021  ? TSH 1.110 08/02/2018  ?  ? ?The 10-year ASCVD risk score (Arnett DK, et al., 2019) is: 8.4%   ? ?--------------------------------------------------------------------------------------------------- ?Anxiety, Follow-up ? ?She was last seen for anxiety 4 months ago. ?Changes made at last visit include continue celexa 471mdaily add Wellbutrin XL 15074maily. ?  ?She reports {excellent/good/fair/poor:19665} compliance with treatment. ?She reports {good/fair/poor:18685} tolerance of treatment. ?She {is/is not:21021397} having side effects. {document side effects if present:1} ? ?She feels her anxiety is {Desc; severity:60313} and {improved/worse/unchanged:3041574} since last visit. ? ?Symptoms: ?{Yes/No:20286} chest pain {Yes/No:20286} difficulty concentrating  ?{Yes/No:20286} dizziness {Yes/No:20286} fatigue  ?{Yes/No:20286} feelings of losing control {Yes/No:20286} insomnia  ?{Yes/No:20286} irritable {Yes/No:20286} palpitations  ?{Yes/No:20286} panic attacks {Yes/No:20286} racing thoughts  ?{Yes/No:20286} shortness of breath {Yes/No:20286} sweating  ?{Yes/No:20286} tremors/shakes   ? ?GAD-7 Results ? ?  08/31/2019  ?  9:12 AM  ?GAD-7 Generalized Anxiety Disorder Screening Tool  ?1. Feeling Nervous, Anxious, or on Edge 2  ?2. Not Being Able to Stop or Control Worrying 3  ?3. Worrying Too Much About Different Things 3  ?4. Trouble Relaxing 2  ?5. Being So Restless it's Hard To Sit Still 2  ?6. Becoming Easily Annoyed or Irritable 3  ?7. Feeling Afraid As If Something Awful Might Happen 2  ?Total GAD-7 Score 17  ?Difficulty At Work, Home, or Getting  Along With Others? Somewhat difficult  ? ? ?PHQ-9 Scores ? ?  10/30/2021  ? 11:01 AM 02/20/2021  ? 11:16 AM 08/31/2019  ?  9:35 AM  ?PHQ9 SCORE ONLY  ?PHQ-9 Total Score 13 6 13   ? ? ?--------------------------------------------------------------------------------------------------- ?Depression, Follow-up ? ?She  was last seen for this 4 months ago. ?Changes made at last visit include continue celexa 28m33mily add Wellbutrin XL 150mg22mly. ?  ?She reports  {excellent/good/fair/poor:19665}  compliance with treatment. ?She {is/is not:21021397} having side effects. *** ? ?She reports {DESC; GOOD/FAIR/POOR:18685} tolerance of treatment. ?Current symptoms include: {Symptoms; depression:1002} ?She feels she is {improved/worse/unchanged:3041574} since last visit. ? ? ?  10/30/2021  ? 11:01 AM 02/20/2021  ? 11:16 AM 08/31/2019  ?  9:35 AM  ?Depression screen PHQ 2/9  ?Decreased Interest 3 0 1  ?Down, Depressed, Hopeless 3 0 1  ?PHQ - 2 Score 6 0 2  ?Altered sleeping 3 0 3  ?Tired, decreased energy 2 3 3   ?Change in appetite 0 0 0  ?Feeling bad or failure about yourself  1 0 1  ?Trouble concentrating 1 3 3   ?Moving slowly or fidgety/restless 0 0 1  ?Suicidal thoughts 0 0 0  ?PHQ-9 Score 13 6 13   ?Difficult doing work/chores Somewhat difficult Not difficult at all Somewhat difficult  ?  ?-----------------------------------------------------------------------------------------  ? ?Medications: ?Outpatient Medications Prior to Visit  ?Medication Sig  ? albuterol (VENTOLIN HFA) 108 (90 Base) MCG/ACT inhaler Inhale 2 puffs into the lungs every 6 (six) hours. (Patient not taking: Reported on 02/20/2021)  ? amLODipine (NORVASC) 5 MG tablet Take 1 tablet (5 mg total) by mouth daily.  ? buPROPion (WELLBUTRIN XL) 150 MG 24 hr tablet Take 1 tablet (150 mg total) by mouth daily.  ? citalopram (CELEXA) 40 MG tablet Take 1 tablet by mouth once daily  ? hydrOXYzine (VISTARIL) 50 MG capsule One daily as needed for anxiety  ? metoprolol succinate (TOPROL-XL) 50 MG 24 hr tablet TAKE 1 TABLET BY MOUTH ONCE DAILY WITH  OR  IMMEDIATELY  FOLLOWING  A  MEAL  ? ?No facility-administered medications prior to visit.  ? ? ?Review of Systems  ?Constitutional:  Negative for appetite change, chills, fatigue and fever.  ?Respiratory:  Negative for chest tightness and shortness of breath.   ?Cardiovascular:  Negative for chest pain and palpitations.  ?Gastrointestinal:  Negative for abdominal pain, nausea and  vomiting.  ?Neurological:  Negative for dizziness and weakness.  ? ?{Labs  Heme  Chem  Endocrine  Serology  Results Review (optional):23779} ?  Objective  ?  ?LMP  (LMP Unknown)  ?{Show previous vital signs (optional):23777} ? ?Physical Exam  ?*** ? ?No results found for any visits on 03/05/22. ? Assessment & Plan  ?  ? ?*** ? ?No follow-ups on file.  ?   ? ?{provider attestation***:1} ? ? ?Lavon Paganini, MD  ?[facility] ?[phone] (phone) ?[phone] (fax) ? ?[facility] ?

## 2022-03-05 ENCOUNTER — Ambulatory Visit: Payer: BC Managed Care – PPO | Admitting: Family Medicine

## 2022-03-05 ENCOUNTER — Encounter: Payer: Self-pay | Admitting: Family Medicine

## 2022-03-05 DIAGNOSIS — F331 Major depressive disorder, recurrent, moderate: Secondary | ICD-10-CM

## 2022-03-05 DIAGNOSIS — F411 Generalized anxiety disorder: Secondary | ICD-10-CM

## 2022-03-05 DIAGNOSIS — I10 Essential (primary) hypertension: Secondary | ICD-10-CM

## 2022-05-24 IMAGING — CR DG FOOT COMPLETE 3+V*L*
3 series · 3 of 3 positions shown · non-contrast
Comparison: None.

CLINICAL DATA: Pain with history of injury

EXAM:
LEFT FOOT - COMPLETE 3+ VIEW

[foot ap]
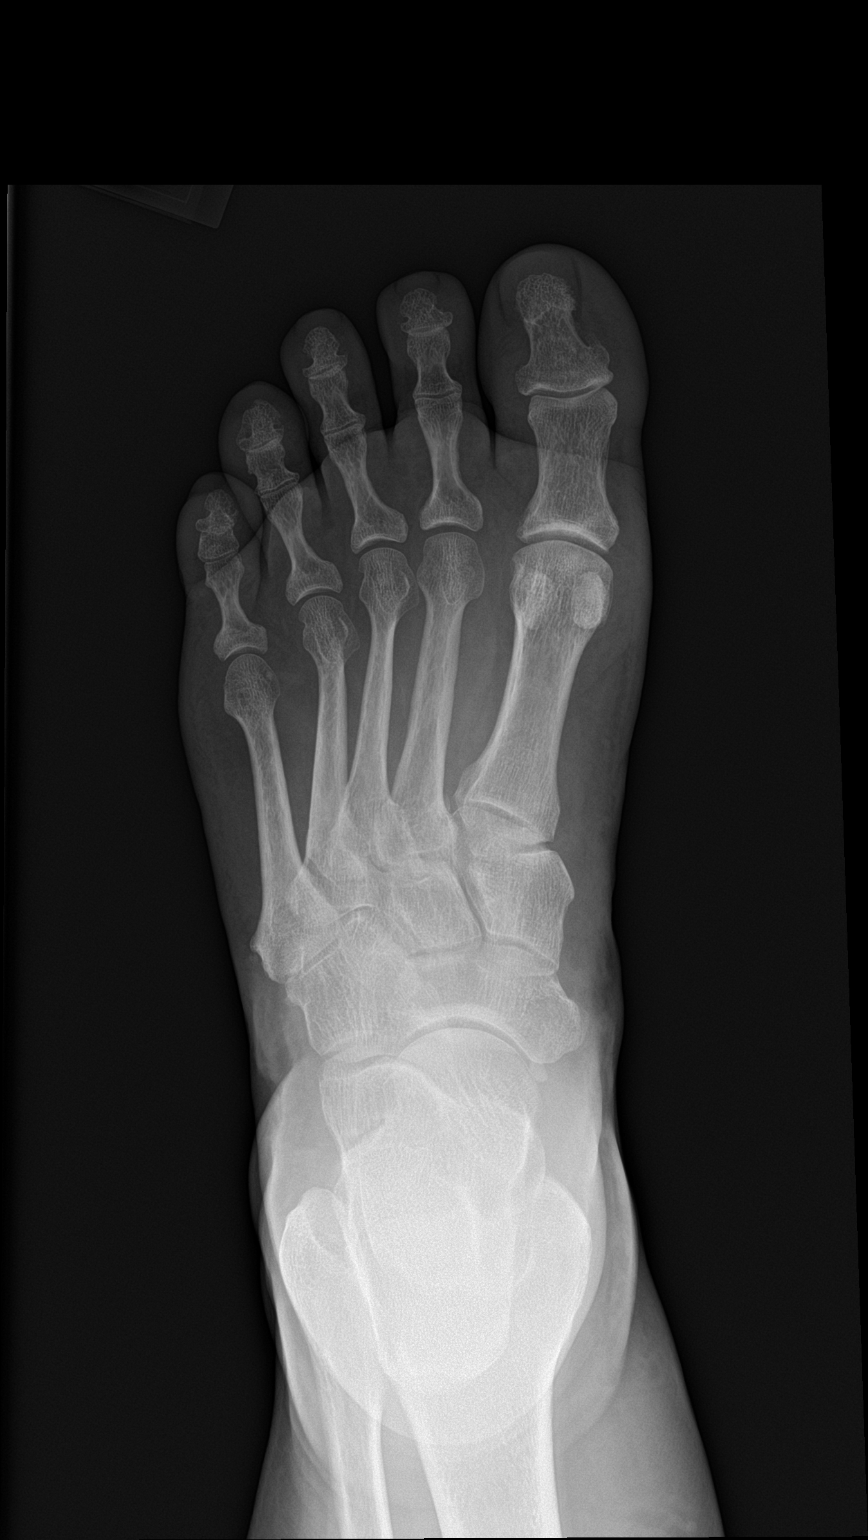

[foot obl]
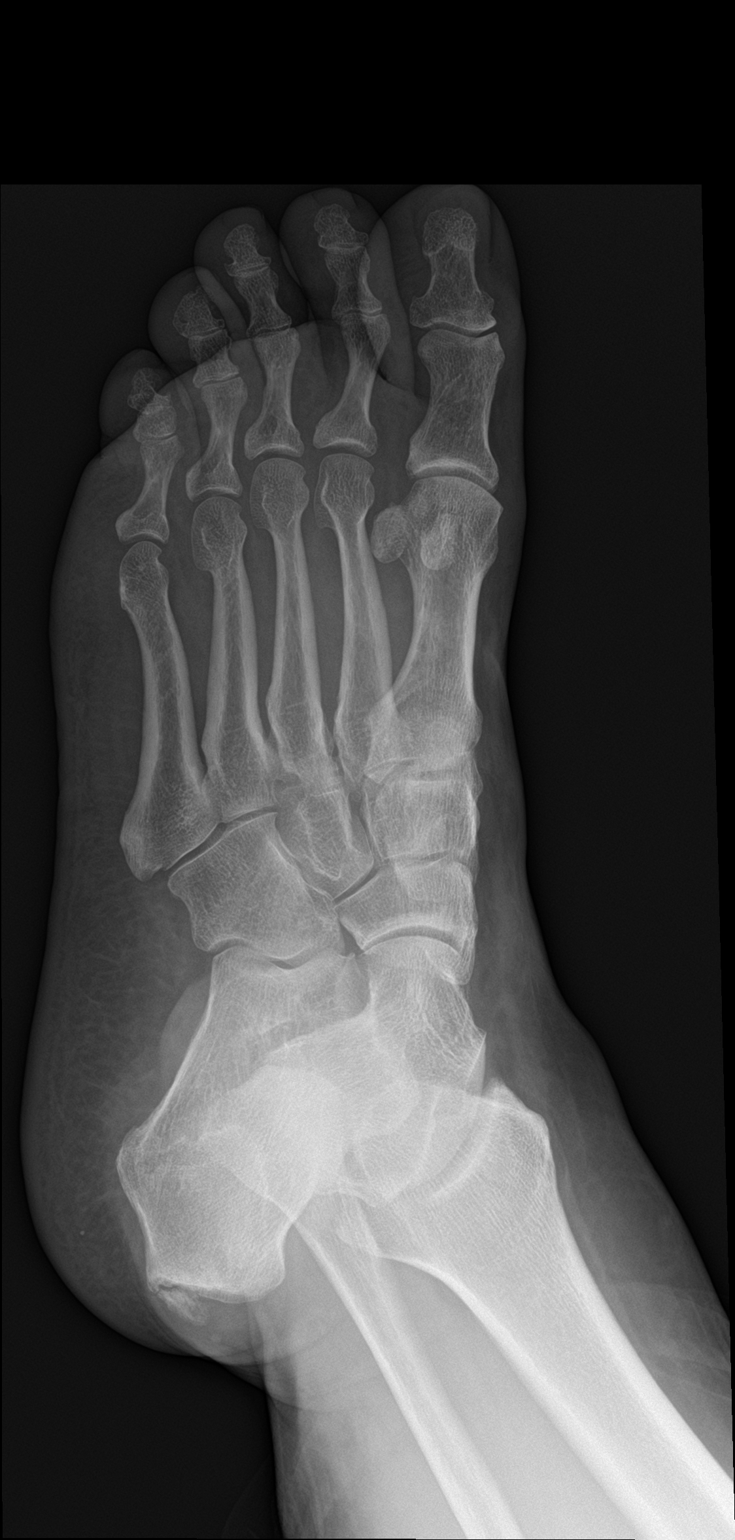

[foot lat]
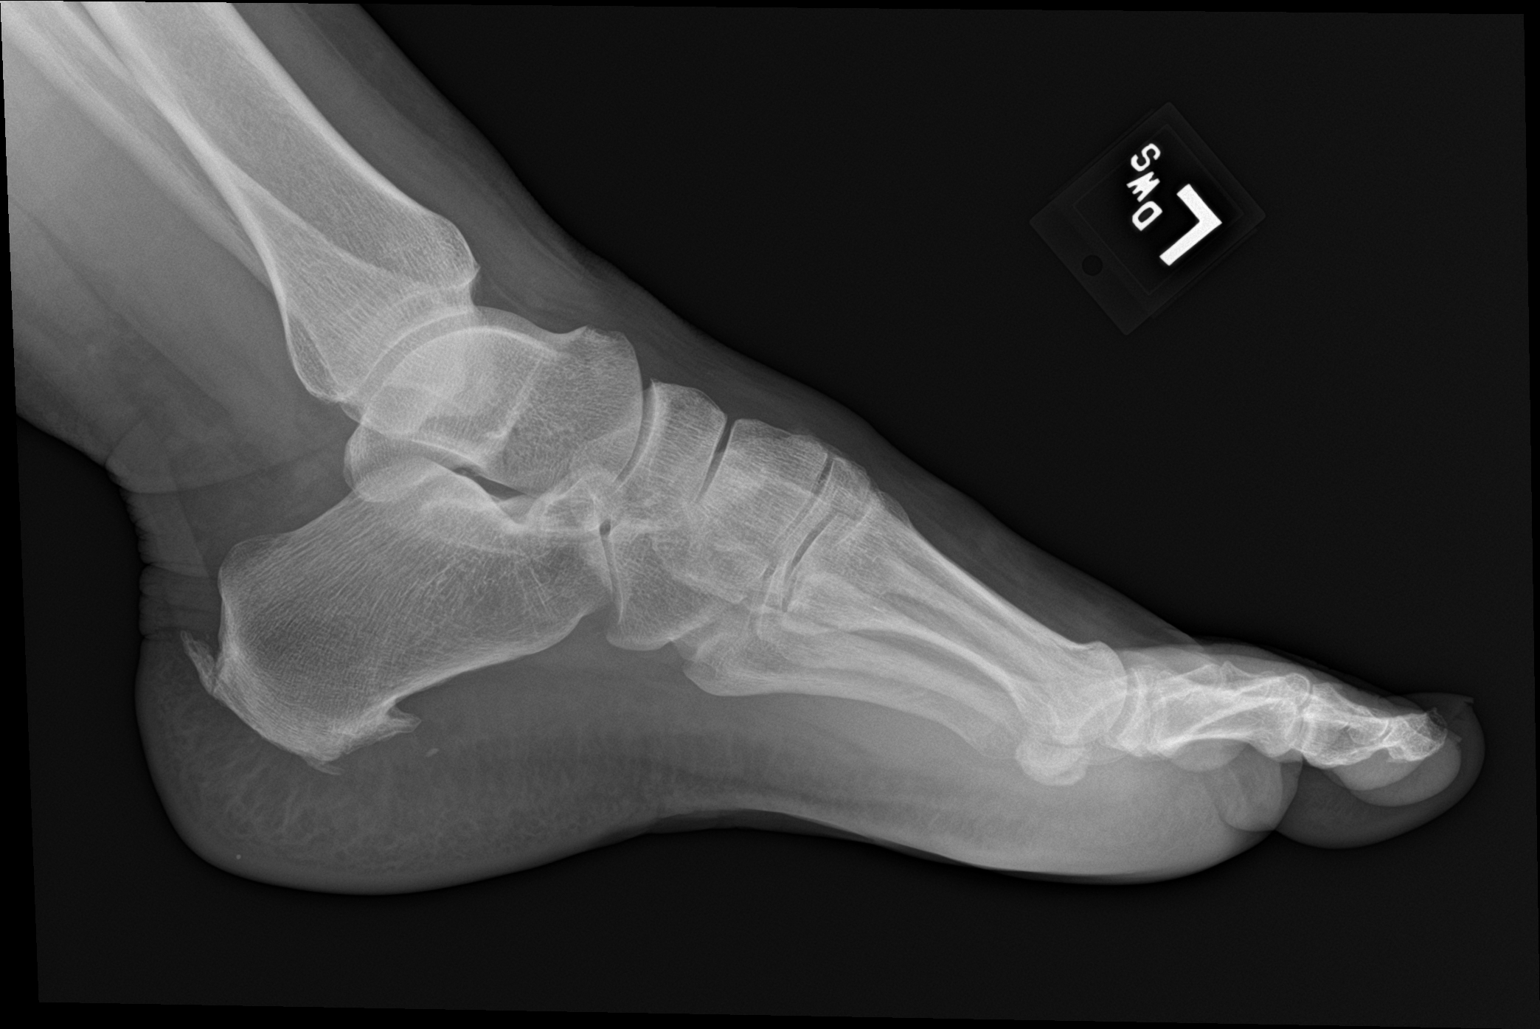

[3 of 3 positions shown; findings below may reference images not displayed]

FINDINGS: There is no evidence of fracture or dislocation. There is no
evidence of significant arthropathy or other focal bone abnormality.
Small calcaneal spur and Achilles tendon enthesophyte. Soft tissues
are unremarkable.
IMPRESSION: No acute osseous abnormality.
